# Patient Record
Sex: Male | Born: 1990 | Race: Black or African American | Hispanic: No | Marital: Single | Smoking: Never smoker
Health system: Southern US, Community
[De-identification: ages and names within clinical notes are randomized; demographics above are authoritative.]

## PROBLEM LIST (undated history)

## (undated) DIAGNOSIS — I82409 Acute embolism and thrombosis of unspecified deep veins of unspecified lower extremity: Secondary | ICD-10-CM

## (undated) DIAGNOSIS — K5090 Crohn's disease, unspecified, without complications: Secondary | ICD-10-CM

## (undated) HISTORY — PX: COLOSTOMY: SHX63

## (undated) HISTORY — PX: COLON SURGERY: SHX602

---

## 2013-10-27 ENCOUNTER — Non-Acute Institutional Stay (HOSPITAL_COMMUNITY)
Admission: AD | Admit: 2013-10-27 | Discharge: 2013-10-27 | Disposition: A | Discharge status: Home / Self Care | Payer: BC Managed Care – PPO | Source: Ambulatory Visit | Attending: Internal Medicine | Admitting: Internal Medicine

## 2013-10-27 ENCOUNTER — Encounter (HOSPITAL_COMMUNITY): Payer: Self-pay | Admitting: Hematology

## 2013-10-27 DIAGNOSIS — K5090 Crohn's disease, unspecified, without complications: Secondary | ICD-10-CM | POA: Insufficient documentation

## 2013-10-27 HISTORY — DX: Crohn's disease, unspecified, without complications: K50.90

## 2013-10-27 MED ORDER — DIPHENHYDRAMINE HCL 25 MG PO CAPS
25.0000 mg | ORAL_CAPSULE | Freq: Once | ORAL | Status: AC
  Administered 2013-10-27: 50 mg via ORAL
  Filled 2013-10-27: qty 2

## 2013-10-27 MED ORDER — ACETAMINOPHEN 325 MG PO TABS
650.0000 mg | ORAL_TABLET | Freq: Once | ORAL | Status: AC
  Administered 2013-10-27: 650 mg via ORAL
  Filled 2013-10-27: qty 2

## 2013-10-27 MED ORDER — SODIUM CHLORIDE 0.9 % IV SOLN
7.5000 mg/kg | Freq: Once | INTRAVENOUS | Status: AC
  Administered 2013-10-27: 600 mg via INTRAVENOUS
  Filled 2013-10-27: qty 60

## 2013-10-27 NOTE — Discharge Instructions (Signed)
Crohn Disease  Crohn disease is a long-term (chronic) soreness and redness (inflammation) of the intestines (bowel). It can affect any portion of the digestive tract, from the mouth to the anus. It can also cause problems outside the digestive tract. Crohn disease is closely related to a disease called ulcerative colitis (together, these two diseases are called inflammatory bowel disease).   CAUSES   The cause of Crohn disease is not known. One theory is that, in an easily affected person, the immune system is triggered to attack the body's own digestive tissue. Crohn disease runs in families. It seems to be more common in certain geographic areas and amongst certain races. There are no clear-cut dietary causes.   SYMPTOMS   Crohn disease can cause many different symptoms since it can affect many different parts of the body. Symptoms include:  · Fatigue.  · Weight loss.  · Chronic diarrhea, sometime bloody.  · Abdominal pain and cramps.  · Fever.  · Ulcers or canker sores in the mouth or rectum.  · Anemia (low red blood cells).  · Arthritis, skin problems, and eye problems may occur.  Complications of Crohn disease can include:  · Series of holes (perforation) of the bowel.  · Portions of the intestines sticking to each other (adhesions).  · Obstruction of the bowel.  · Fistula formation, typically in the rectal area but also in other areas. A fistula is an opening between the bowels and the outside, or between the bowels and another organ.  · A painful crack in the mucous membrane of the anus (rectal fissure).  DIAGNOSIS   Your caregiver may suspect Crohn disease based on your symptoms and an exam. Blood tests may confirm that there is a problem. You may be asked to submit a stool specimen for examination. X-rays and CT scans may be necessary. Ultimately, the diagnosis is usually made after a procedure that uses a flexible tube that is inserted via your mouth or your anus. This is done under sedation and is called  either an upper endoscopy or colonoscopy. With these tests, the specialist can take tiny tissue samples and remove them from the inside of the bowel (biopsy). Examination of this biopsy tissue under a microscope can reveal Crohn disease as the cause of your symptoms.  Due to the many different forms that Crohn disease can take, symptoms may be present for several years before a diagnosis is made.  TREATMENT   Medications are often used to decrease inflammation and control the immune system. These include medicines related to aspirin, steroid medications, and newer and stronger medications to slow down the immune system. Some medications may be used as suppositories or enemas. A number of other medications are used or have been studied. Your caregiver will make specific recommendations.  HOME CARE INSTRUCTIONS   · Symptoms such as diarrhea can be controlled with medications. Avoid foods that have a laxative effect such as fresh fruit, vegetables, and dairy products. During flare-ups, you can rest your bowel by refraining from solid foods. Drink clear liquids frequently during the day. (Electrolyte or rehydrating fluids are best. Your caregiver can help you with suggestions.) Drink often to prevent loss of body fluids (dehydration). When diarrhea has cleared, eat small meals and more frequently. Avoid food additives and stimulants such as caffeine (coffee, tea, or chocolate). Enzyme supplements may help if you develop intolerance to a sugar in dairy products (lactose). Ask your caregiver or dietitian about specific dietary instructions.  · Try to   maintain a positive attitude. Learn relaxation techniques such as self-hypnosis, mental imaging, and muscle relaxation.  · If possible, avoid stresses which can aggravate your condition.  · Exercise regularly.  · Follow your diet.  · Always get plenty of rest.  SEEK MEDICAL CARE IF:   · Your symptoms fail to improve after a week or two of new treatment.  · You experience  continued weight loss.  · You have ongoing cramps or loose bowels.  · You develop a new skin rash, skin sores, or eye problems.  SEEK IMMEDIATE MEDICAL CARE IF:   · You have worsening of your symptoms or develop new symptoms.  · You have a fever.  · You develop bloody diarrhea.  · You develop severe abdominal pain.  MAKE SURE YOU:   · Understand these instructions.  · Will watch your condition.  · Will get help right away if you are not doing well or get worse.  Document Released: 10/09/2004 Document Revised: 05/16/2013 Document Reviewed: 09/07/2006  ExitCare® Patient Information ©2015 [employer]. This information is not intended to replace advice given to you by your health care provider. Make sure you discuss any questions you have with your health care provider.

## 2013-10-27 NOTE — Procedures (Signed)
[facility]  Procedure Note  Tom Ferguson [id number] DOB: 26-Jan-1990 DOA: 10/27/2013   PCP: Dr. Kirtland BouchardK. Shelton   Associated Diagnosis: Crohn's Disease  Procedure Note: A copy of MD progress notes and orders and labs faxed to pharmacy, IV access obtained with assist by IV team RN, Premeds given, Infusion completed as ordered, VS completed per protocol. IV discontinued.   Condition During Procedure: Patient tolerated well. No complaints. VSS   Condition at Discharge: Tolerated Well. No complaints. VSS. Patient in no apparent distress. Instructed patient to seek medical attention if any changes in condition, patient acknowledges. Patient discharged home. Patient left day hospital with belongings ambulatory via self.    Allyn KennerBushnell, [name], RN  [facility]

## 2013-12-22 ENCOUNTER — Ambulatory Visit (HOSPITAL_COMMUNITY)
Admission: RE | Admit: 2013-12-22 | Discharge: 2013-12-22 | Disposition: A | Discharge status: Home / Self Care | Payer: BC Managed Care – PPO | Source: Ambulatory Visit | Attending: Gastroenterology | Admitting: Gastroenterology

## 2013-12-22 ENCOUNTER — Non-Acute Institutional Stay (HOSPITAL_COMMUNITY): Payer: BC Managed Care – PPO

## 2013-12-22 ENCOUNTER — Non-Acute Institutional Stay (HOSPITAL_COMMUNITY)
Admission: AD | Admit: 2013-12-22 | Payer: BC Managed Care – PPO | Source: Ambulatory Visit | Admitting: Gastroenterology

## 2013-12-22 DIAGNOSIS — K5090 Crohn's disease, unspecified, without complications: Secondary | ICD-10-CM | POA: Diagnosis not present

## 2013-12-22 MED ORDER — DIPHENHYDRAMINE HCL 25 MG PO CAPS
50.0000 mg | ORAL_CAPSULE | Freq: Once | ORAL | Status: AC
  Administered 2013-12-22: 50 mg via ORAL

## 2013-12-22 MED ORDER — METHYLPREDNISOLONE SODIUM SUCC 40 MG IJ SOLR
40.0000 mg | Freq: Once | INTRAMUSCULAR | Status: AC
  Administered 2013-12-22: 40 mg via INTRAVENOUS
  Filled 2013-12-22: qty 1

## 2013-12-22 MED ORDER — DIPHENHYDRAMINE HCL 25 MG PO CAPS
ORAL_CAPSULE | ORAL | Status: AC
  Filled 2013-12-22: qty 2

## 2013-12-22 MED ORDER — SODIUM CHLORIDE 0.9 % IV SOLN
7.5000 mg/kg | Freq: Once | INTRAVENOUS | Status: AC
  Administered 2013-12-22: 600 mg via INTRAVENOUS
  Filled 2013-12-22: qty 60

## 2013-12-22 MED ORDER — ACETAMINOPHEN 325 MG PO TABS: 650.0000 mg | ORAL_TABLET | Freq: Once | ORAL | Status: DC

## 2013-12-22 MED ORDER — ACETAMINOPHEN 325 MG PO TABS
650.0000 mg | ORAL_TABLET | Freq: Once | ORAL | Status: AC
  Administered 2013-12-22: 650 mg via ORAL
  Filled 2013-12-22: qty 2

## 2013-12-22 NOTE — Procedures (Signed)
[facility]  Procedure Note  Tom Ferguson [id number] DOB: 1990/11/25 DOA: (Not on file)   PCP: No primary care provider on file.   Associated Diagnosis: Crohn's Disease  Procedure Note: Infusion of Remicade   Condition During Procedure:  Pt tolerated well   Condition at Discharge:  Pt alert, oriented, ambulatory; no complications noted   Katrinka Blazing, Joslyn Hy, RN  [facility]

## 2014-01-31 ENCOUNTER — Telehealth: Payer: Self-pay | Admitting: Gastroenterology

## 2014-01-31 ENCOUNTER — Other Ambulatory Visit: Payer: Self-pay | Admitting: Gastroenterology

## 2014-01-31 DIAGNOSIS — K50919 Crohn's disease, unspecified, with unspecified complications: Secondary | ICD-10-CM

## 2014-01-31 DIAGNOSIS — D509 Iron deficiency anemia, unspecified: Secondary | ICD-10-CM

## 2014-01-31 NOTE — Telephone Encounter (Signed)
Received call from [facility] at Dr. Kathline Magic office to schedule Remicade infusion for 02/16/14. Lenna Sciara will call insurance company to obtain prior authorization.

## 2014-02-07 ENCOUNTER — Ambulatory Visit
Admission: RE | Admit: 2014-02-07 | Discharge: 2014-02-07 | Disposition: A | Discharge status: Home / Self Care | Payer: BLUE CROSS/BLUE SHIELD | Source: Ambulatory Visit | Attending: Gastroenterology | Admitting: Gastroenterology

## 2014-02-07 DIAGNOSIS — K50919 Crohn's disease, unspecified, with unspecified complications: Secondary | ICD-10-CM

## 2014-02-07 DIAGNOSIS — D509 Iron deficiency anemia, unspecified: Secondary | ICD-10-CM

## 2014-02-09 ENCOUNTER — Other Ambulatory Visit: Payer: Self-pay | Admitting: Gastroenterology

## 2014-02-09 DIAGNOSIS — D509 Iron deficiency anemia, unspecified: Secondary | ICD-10-CM

## 2014-02-09 DIAGNOSIS — K50919 Crohn's disease, unspecified, with unspecified complications: Secondary | ICD-10-CM

## 2014-02-14 ENCOUNTER — Telehealth: Payer: Self-pay | Admitting: Internal Medicine

## 2014-02-14 ENCOUNTER — Ambulatory Visit
Admission: RE | Admit: 2014-02-14 | Discharge: 2014-02-14 | Disposition: A | Discharge status: Home / Self Care | Payer: BLUE CROSS/BLUE SHIELD | Source: Ambulatory Visit | Attending: Gastroenterology | Admitting: Gastroenterology

## 2014-02-14 DIAGNOSIS — D509 Iron deficiency anemia, unspecified: Secondary | ICD-10-CM

## 2014-02-14 DIAGNOSIS — K50919 Crohn's disease, unspecified, with unspecified complications: Secondary | ICD-10-CM

## 2014-02-14 MED ORDER — IOHEXOL 300 MG/ML  SOLN
125.0000 mL | Freq: Once | INTRAMUSCULAR | Status: AC | PRN
  Administered 2014-02-14: 125 mL via INTRAVENOUS

## 2014-02-16 ENCOUNTER — Ambulatory Visit (HOSPITAL_COMMUNITY)
Admission: RE | Admit: 2014-02-16 | Discharge: 2014-02-16 | Disposition: A | Discharge status: Home / Self Care | Payer: BLUE CROSS/BLUE SHIELD | Source: Ambulatory Visit | Attending: Gastroenterology | Admitting: Gastroenterology

## 2014-02-16 DIAGNOSIS — Z79899 Other long term (current) drug therapy: Secondary | ICD-10-CM | POA: Insufficient documentation

## 2014-02-16 DIAGNOSIS — K5090 Crohn's disease, unspecified, without complications: Secondary | ICD-10-CM | POA: Diagnosis present

## 2014-02-16 MED ORDER — ACETAMINOPHEN 325 MG PO TABS
650.0000 mg | ORAL_TABLET | Freq: Once | ORAL | Status: AC
  Administered 2014-02-16: 650 mg via ORAL
  Filled 2014-02-16: qty 2

## 2014-02-16 MED ORDER — SODIUM CHLORIDE 0.9 % IV SOLN
7.5000 mg/kg | Freq: Once | INTRAVENOUS | Status: AC
  Administered 2014-02-16: 600 mg via INTRAVENOUS
  Filled 2014-02-16: qty 60

## 2014-02-16 MED ORDER — DIPHENHYDRAMINE HCL 25 MG PO CAPS
50.0000 mg | ORAL_CAPSULE | Freq: Once | ORAL | Status: AC
  Administered 2014-02-16: 50 mg via ORAL
  Filled 2014-02-16: qty 2

## 2014-02-16 MED ORDER — METHYLPREDNISOLONE SODIUM SUCC 125 MG IJ SOLR
40.0000 mg | Freq: Once | INTRAMUSCULAR | Status: AC
  Administered 2014-02-16: 40 mg via INTRAVENOUS
  Filled 2014-02-16: qty 2

## 2014-02-16 NOTE — Procedures (Signed)
[facility]  Procedure Note  Tom Ferguson [id number] DOB: November 23, 1990 DOA: 02/16/2014   Dr. Bosie Clos  Associated Diagnosis: Chron's Disease  Procedure Note: IV started, pre medications given, Remicaid infused per order, IV discontinued   Condition During Procedure: denies any complaints   Condition at Discharge: patient stable at discharge   Lanae Boast, RN  [facility]

## 2014-02-16 NOTE — Progress Notes (Signed)
School excuse printed thru system.  Original excuse had incorrect return date of 02/27/2014.  Note reprinted, and corrected to show patient able to return to school on 02/17/2014.

## 2014-04-13 ENCOUNTER — Ambulatory Visit (HOSPITAL_COMMUNITY)
Admission: RE | Admit: 2014-04-13 | Discharge: 2014-04-13 | Disposition: A | Discharge status: Home / Self Care | Payer: BLUE CROSS/BLUE SHIELD | Source: Ambulatory Visit | Attending: Gastroenterology | Admitting: Gastroenterology

## 2014-04-13 DIAGNOSIS — K5090 Crohn's disease, unspecified, without complications: Secondary | ICD-10-CM | POA: Diagnosis present

## 2014-04-13 MED ORDER — SODIUM CHLORIDE 0.9 % IV SOLN
7.5000 mg/kg | Freq: Once | INTRAVENOUS | Status: AC
  Administered 2014-04-13: 600 mg via INTRAVENOUS
  Filled 2014-04-13: qty 60

## 2014-04-13 MED ORDER — METHYLPREDNISOLONE SODIUM SUCC 125 MG IJ SOLR
40.0000 mg | Freq: Once | INTRAMUSCULAR | Status: AC
  Administered 2014-04-13: 40 mg via INTRAVENOUS
  Filled 2014-04-13: qty 2

## 2014-04-13 MED ORDER — DIPHENHYDRAMINE HCL 25 MG PO CAPS
50.0000 mg | ORAL_CAPSULE | Freq: Once | ORAL | Status: AC
  Administered 2014-04-13: 50 mg via ORAL
  Filled 2014-04-13: qty 2

## 2014-04-13 MED ORDER — ACETAMINOPHEN 325 MG PO TABS
650.0000 mg | ORAL_TABLET | Freq: Once | ORAL | Status: AC
  Administered 2014-04-13: 650 mg via ORAL
  Filled 2014-04-13: qty 2

## 2014-04-13 NOTE — Procedures (Signed)
[facility]  Procedure Note  Wlliam Grosso [id number] DOB: 1990-11-06 DOA: 04/13/2014   Dr. Bosie Clos  Associated Diagnosis: Chron's disease  Procedure Note:   IV started, pre medications given per order, Remicade infused per order, IV discontinued   Condition During Procedure:  Patient stable, no signs/symptoms of reaction   Condition at Discharge:  Patient stable, ambulatory at discharge   Lanae Boast, RN  [facility]

## 2014-05-25 ENCOUNTER — Telehealth: Payer: Self-pay | Admitting: Oncology

## 2014-05-25 NOTE — Telephone Encounter (Signed)
Called left second message regarding new pt appt.

## 2014-06-01 ENCOUNTER — Telehealth: Payer: Self-pay | Admitting: Oncology

## 2014-06-01 NOTE — Telephone Encounter (Signed)
left third message for patient.    Called referring office to notify them of unsuccessful attempts to reach patient.

## 2014-06-08 ENCOUNTER — Ambulatory Visit (HOSPITAL_COMMUNITY)
Admission: RE | Admit: 2014-06-08 | Discharge: 2014-06-08 | Disposition: A | Discharge status: Home / Self Care | Payer: BLUE CROSS/BLUE SHIELD | Source: Ambulatory Visit | Attending: Gastroenterology | Admitting: Gastroenterology

## 2014-06-08 DIAGNOSIS — K5090 Crohn's disease, unspecified, without complications: Secondary | ICD-10-CM | POA: Diagnosis present

## 2014-06-08 MED ORDER — INFLIXIMAB 100 MG IV SOLR
7.5000 mg/kg | Freq: Once | INTRAVENOUS | Status: AC
  Administered 2014-06-08: 700 mg via INTRAVENOUS
  Filled 2014-06-08: qty 70

## 2014-06-08 MED ORDER — METHYLPREDNISOLONE SODIUM SUCC 125 MG IJ SOLR
40.0000 mg | Freq: Once | INTRAMUSCULAR | Status: AC
  Administered 2014-06-08: 40 mg via INTRAVENOUS
  Filled 2014-06-08: qty 2

## 2014-06-08 MED ORDER — ACETAMINOPHEN 325 MG PO TABS
650.0000 mg | ORAL_TABLET | Freq: Once | ORAL | Status: AC
  Administered 2014-06-08: 650 mg via ORAL
  Filled 2014-06-08: qty 2

## 2014-06-08 MED ORDER — SODIUM CHLORIDE 0.9 % IV SOLN: 7.5000 mg/kg | Freq: Once | INTRAVENOUS | Status: DC

## 2014-06-08 MED ORDER — DIPHENHYDRAMINE HCL 25 MG PO CAPS
50.0000 mg | ORAL_CAPSULE | Freq: Once | ORAL | Status: AC
  Administered 2014-06-08: 50 mg via ORAL
  Filled 2014-06-08: qty 2

## 2014-06-08 NOTE — Progress Notes (Signed)
Patient IV started, premeds given and remicade infused. Tolerated infusion well. IV removed. Patient next appointment scheduled for August 10, 2014 @ 9am. Patient alert, oriented and ambulatory after infusion.

## 2014-08-09 ENCOUNTER — Ambulatory Visit (HOSPITAL_COMMUNITY): Admission: RE | Admit: 2014-08-09 | Payer: BLUE CROSS/BLUE SHIELD | Source: Ambulatory Visit

## 2014-10-03 ENCOUNTER — Ambulatory Visit (HOSPITAL_COMMUNITY)
Admission: RE | Admit: 2014-10-03 | Discharge: 2014-10-03 | Disposition: A | Discharge status: Home / Self Care | Payer: BLUE CROSS/BLUE SHIELD | Source: Ambulatory Visit | Attending: Gastroenterology | Admitting: Gastroenterology

## 2014-10-03 DIAGNOSIS — K5090 Crohn's disease, unspecified, without complications: Secondary | ICD-10-CM | POA: Diagnosis present

## 2014-10-03 MED ORDER — ACETAMINOPHEN 325 MG PO TABS
650.0000 mg | ORAL_TABLET | Freq: Once | ORAL | Status: AC
  Administered 2014-10-03: 650 mg via ORAL
  Filled 2014-10-03: qty 2

## 2014-10-03 MED ORDER — SODIUM CHLORIDE 0.9 % IV SOLN
7.5000 mg/kg | Freq: Once | INTRAVENOUS | Status: AC
  Administered 2014-10-03: 800 mg via INTRAVENOUS
  Filled 2014-10-03: qty 80

## 2014-10-03 MED ORDER — DIPHENHYDRAMINE HCL 25 MG PO CAPS
50.0000 mg | ORAL_CAPSULE | Freq: Once | ORAL | Status: AC
  Administered 2014-10-03: 50 mg via ORAL
  Filled 2014-10-03: qty 2

## 2014-10-03 MED ORDER — METHYLPREDNISOLONE SODIUM SUCC 125 MG IJ SOLR
40.0000 mg | Freq: Once | INTRAMUSCULAR | Status: AC
  Administered 2014-10-03: 40 mg via INTRAVENOUS
  Filled 2014-10-03: qty 2

## 2014-10-03 NOTE — Progress Notes (Signed)
Deljuan Blaz [id number] DOB: 09-07-1990 DOA:10/03/2014   Dr. Bosie Clos  Associated Diagnosis: Chron's disease  Procedure Note: IV started, pre medications given per order, Remicade infused per order, IV discontinued   Condition During Procedure: Patient stable, no signs/symptoms of reaction   Condition at Discharge: Patient stable, ambulatory at discharge

## 2014-11-28 ENCOUNTER — Ambulatory Visit (HOSPITAL_COMMUNITY)
Admission: RE | Admit: 2014-11-28 | Discharge: 2014-11-28 | Disposition: A | Discharge status: Home / Self Care | Payer: BLUE CROSS/BLUE SHIELD | Source: Ambulatory Visit | Attending: Gastroenterology | Admitting: Gastroenterology

## 2014-11-28 DIAGNOSIS — K5090 Crohn's disease, unspecified, without complications: Secondary | ICD-10-CM | POA: Diagnosis present

## 2014-11-28 MED ORDER — ACETAMINOPHEN 325 MG PO TABS
650.0000 mg | ORAL_TABLET | Freq: Four times a day (QID) | ORAL | Status: DC | PRN
  Administered 2014-11-28: 650 mg via ORAL
  Filled 2014-11-28: qty 2

## 2014-11-28 MED ORDER — DIPHENHYDRAMINE HCL 25 MG PO CAPS
50.0000 mg | ORAL_CAPSULE | Freq: Four times a day (QID) | ORAL | Status: DC | PRN
  Administered 2014-11-28: 50 mg via ORAL
  Filled 2014-11-28: qty 2

## 2014-11-28 MED ORDER — SODIUM CHLORIDE 0.9 % IV SOLN
7.0000 mg/kg | Freq: Once | INTRAVENOUS | Status: AC
  Administered 2014-11-28: 700 mg via INTRAVENOUS
  Filled 2014-11-28: qty 70

## 2014-11-28 MED ORDER — METHYLPREDNISOLONE SODIUM SUCC 125 MG IJ SOLR
40.0000 mg | Freq: Once | INTRAMUSCULAR | Status: AC
  Administered 2014-11-28: 40 mg via INTRAVENOUS
  Filled 2014-11-28: qty 2

## 2014-11-28 NOTE — Procedures (Addendum)
[facility]  Procedure Note  Kebin Merrick [id number] DOB: 1990-07-30 DOA: 11/28/2014   Dr. Bosie Clos  Associated Diagnosis: Chron's disease  Procedure Note: IV started, pre medications given per order, Remicade infused per order, IV discontinued   Condition During Procedure: Patient stable, no signs/symptoms of reaction   Condition at Discharge: Patient stable, ambulatory at discharge  Patient instructed to have provider send a new order for next scheduled infusion on 01/10/20107.  Patient verbalizes understanding.  Lanae Boast, RN  [facility]

## 2015-01-23 ENCOUNTER — Ambulatory Visit (HOSPITAL_COMMUNITY)
Admission: RE | Admit: 2015-01-23 | Payer: BLUE CROSS/BLUE SHIELD | Source: Ambulatory Visit | Attending: Gastroenterology | Admitting: Gastroenterology

## 2015-01-29 ENCOUNTER — Ambulatory Visit (HOSPITAL_COMMUNITY)
Admission: RE | Admit: 2015-01-29 | Discharge: 2015-01-29 | Disposition: A | Discharge status: Home / Self Care | Payer: BLUE CROSS/BLUE SHIELD | Source: Ambulatory Visit | Attending: Gastroenterology | Admitting: Gastroenterology

## 2015-01-29 DIAGNOSIS — K5090 Crohn's disease, unspecified, without complications: Secondary | ICD-10-CM | POA: Diagnosis present

## 2015-01-29 MED ORDER — INFLIXIMAB 100 MG IV SOLR
7.0000 mg/kg | Freq: Once | INTRAVENOUS | Status: AC
  Administered 2015-01-29: 600 mg via INTRAVENOUS
  Filled 2015-01-29: qty 60

## 2015-01-29 MED ORDER — ACETAMINOPHEN 325 MG PO TABS
650.0000 mg | ORAL_TABLET | Freq: Once | ORAL | Status: AC
  Administered 2015-01-29: 650 mg via ORAL
  Filled 2015-01-29: qty 2

## 2015-01-29 MED ORDER — DIPHENHYDRAMINE HCL 25 MG PO CAPS
50.0000 mg | ORAL_CAPSULE | Freq: Once | ORAL | Status: AC
  Administered 2015-01-29: 50 mg via ORAL
  Filled 2015-01-29: qty 2

## 2015-01-29 MED ORDER — METHYLPREDNISOLONE SODIUM SUCC 125 MG IJ SOLR
40.0000 mg | Freq: Once | INTRAMUSCULAR | Status: AC
  Administered 2015-01-29: 40 mg via INTRAVENOUS
  Filled 2015-01-29: qty 2

## 2015-01-29 NOTE — Procedures (Signed)
[facility]  Procedure Note  Tom Ferguson [id number] DOB: 1990/01/22 DOA: 01/29/2015   PCP: No primary care provider on file.   Associated Diagnosis: Crohn's Disease  Procedure Note: IV infusion of remicade; PO tylenol and benadryl; IV push of solumedrol pre infusion   Condition During Procedure:  Pt tolerated well   Condition at Discharge:  No complications noted    Bluford Kaufmann, RN  [facility]

## 2015-03-08 ENCOUNTER — Encounter (HOSPITAL_COMMUNITY): Payer: Self-pay | Admitting: *Deleted

## 2015-03-08 ENCOUNTER — Emergency Department (HOSPITAL_COMMUNITY)
Admission: EM | Admit: 2015-03-08 | Discharge: 2015-03-09 | Disposition: A | Discharge status: Home / Self Care | Payer: BLUE CROSS/BLUE SHIELD | Attending: Emergency Medicine | Admitting: Emergency Medicine

## 2015-03-08 DIAGNOSIS — G43919 Migraine, unspecified, intractable, without status migrainosus: Secondary | ICD-10-CM | POA: Insufficient documentation

## 2015-03-08 DIAGNOSIS — Z8719 Personal history of other diseases of the digestive system: Secondary | ICD-10-CM | POA: Insufficient documentation

## 2015-03-08 DIAGNOSIS — D649 Anemia, unspecified: Secondary | ICD-10-CM | POA: Insufficient documentation

## 2015-03-08 DIAGNOSIS — R51 Headache: Secondary | ICD-10-CM | POA: Diagnosis present

## 2015-03-08 MED ORDER — SODIUM CHLORIDE 0.9 % IV BOLUS (SEPSIS)
1000.0000 mL | Freq: Once | INTRAVENOUS | Status: AC
  Administered 2015-03-08: 1000 mL via INTRAVENOUS

## 2015-03-08 MED ORDER — KETOROLAC TROMETHAMINE 15 MG/ML IJ SOLN
15.0000 mg | Freq: Once | INTRAMUSCULAR | Status: AC
Start: 2015-03-08 — End: 2015-03-08
  Administered 2015-03-08: 15 mg via INTRAVENOUS
  Filled 2015-03-08: qty 1

## 2015-03-08 MED ORDER — DIPHENHYDRAMINE HCL 50 MG/ML IJ SOLN
25.0000 mg | Freq: Once | INTRAMUSCULAR | Status: AC
  Administered 2015-03-08: 25 mg via INTRAVENOUS
  Filled 2015-03-08: qty 1

## 2015-03-08 MED ORDER — METOCLOPRAMIDE HCL 5 MG/ML IJ SOLN
10.0000 mg | Freq: Once | INTRAMUSCULAR | Status: AC
  Administered 2015-03-08: 10 mg via INTRAVENOUS
  Filled 2015-03-08: qty 2

## 2015-03-08 NOTE — ED Notes (Signed)
Pt states R frontal headache since Sunday.  Pain varies in intensity but is constant and is not relieved by tylenol.  States photophobia and intermittent blurred vision, but denies nausea.

## 2015-03-08 NOTE — ED Provider Notes (Addendum)
CSN: [id number]     Arrival date & time 03/08/15  1759 History  By signing my name below, I, Nicole Kindred, attest that this documentation has been prepared under the direction and in the presence of Blanchie Dessert, MD.   Electronically Signed: Nicole Kindred, ED Scribe. 03/09/2015. 2:39 AM     Chief Complaint  Patient presents with  . Headache    The history is provided by the patient. No language interpreter was used.   HPI Comments: Tom Ferguson is a 25 y.o. male with PMHx of Crohn's disease who presents to the Emergency Department complaining of gradual onset, intermittent, 7/10, headache, onset about five days ago. He states a slight sore throat before the symptoms began and says that the pain from the headache can worsen to 9/10 at times. Pt reports associated pain and pressure behind his right eye, mild neck pain, photophobia, and chills. He also states intermittent visual disturbances in the right eye that come and go with his headaches. Pt has taken ibuprofen at home with no relief to symptoms. Pt denies congestion, cough, no dental pain, nausea, weakness, or any other pertinent symptoms. Pt is on remicade infusions but says he has never had any complications with the medicine.      Past Medical History  Diagnosis Date  . Crohn's disease [facility])    Past Surgical History  Procedure Laterality Date  . Colon surgery      colostomy   No family history on file. Social History  Substance Use Topics  . Smoking status: Never Smoker   . Smokeless tobacco: None  . Alcohol Use: No    Review of Systems A complete 10 system review of systems was obtained and all systems are negative except as noted in the HPI and PMH.    Allergies  Review of patient's allergies indicates no known allergies.  Home Medications   Prior to Admission medications   Medication Sig Start Date End Date Taking? Authorizing Provider  acetaminophen (TYLENOL) 325 MG tablet Take 650 mg by mouth  every 6 (six) hours as needed for headache.   Yes Historical Provider, MD  ibuprofen (ADVIL,MOTRIN) 200 MG tablet Take 200 mg by mouth every 6 (six) hours as needed for headache.   Yes Historical Provider, MD   BP 128/64 mmHg  Pulse 84  Temp(Src) 98.6 F (37 C) (Oral)  Resp 16  Ht 6' 2"  (1.88 m)  Wt 238 lb (107.956 kg)  BMI 30.54 kg/m2  SpO2 100% Physical Exam  Constitutional: He is oriented to person, place, and time. He appears well-developed and well-nourished.  HENT:  Head: Normocephalic and atraumatic.  Right Ear: Tympanic membrane, external ear and ear canal normal.  Left Ear: Tympanic membrane, external ear and ear canal normal.  Nose: Nose normal.  Mouth/Throat: Oropharynx is clear and moist and mucous membranes are normal.  Eyes: EOM are normal. Pupils are equal, round, and reactive to light.  Slight photophobia.   Neck: Normal range of motion. Muscular tenderness present. No Brudzinski's sign and no Kernig's sign noted.    No meningeal signs.  Mild TTP to neck.   Cardiovascular: Normal rate, regular rhythm, normal heart sounds and intact distal pulses.   Pulmonary/Chest: Effort normal and breath sounds normal. No respiratory distress.  Abdominal: Soft. He exhibits no distension. There is no tenderness.  Musculoskeletal: Normal range of motion.  Neurological: He is alert and oriented to person, place, and time. He has normal strength. No cranial nerve deficit or sensory deficit.  Coordination and gait normal.  5/5 strength in all extremities and sensation intact  Skin: Skin is warm and dry.  Psychiatric: He has a normal mood and affect. Judgment normal.  Nursing note and vitals reviewed.   ED Course  Procedures (including critical care time) DIAGNOSTIC STUDIES: Oxygen Saturation is 100% on RA, normal by my interpretation.    COORDINATION OF CARE: 11:27 PM-Discussed treatment plan which includes benadryl, Toradol, CT head without contrast, CBC, and I-state chem 8  with pt at bedside and pt agreed to plan.    Labs Review Labs Reviewed  CBC WITH DIFFERENTIAL/PLATELET - Abnormal; Notable for the following:    WBC 11.5 (*)    Hemoglobin 6.0 (*)    HCT 24.7 (*)    MCV 56.0 (*)    MCH 13.6 (*)    MCHC 24.3 (*)    RDW 24.3 (*)    Platelets 702 (*)    Neutro Abs 8.8 (*)    All other components within normal limits  I-STAT CHEM 8, ED - Abnormal; Notable for the following:    Glucose, Bld 117 (*)    Hemoglobin 7.8 (*)    HCT 23.0 (*)    All other components within normal limits  TYPE AND SCREEN  PREPARE RBC (CROSSMATCH)  ABO/RH    Imaging Review Ct Head Wo Contrast  03/09/2015  CLINICAL DATA:  Acute onset of right frontal headache. Photophobia and blurred vision. Initial encounter. EXAM: CT HEAD WITHOUT CONTRAST TECHNIQUE: Contiguous axial images were obtained from the base of the skull through the vertex without intravenous contrast. COMPARISON:  None. FINDINGS: There is no evidence of acute infarction, mass lesion, or intra- or extra-axial hemorrhage on CT. The posterior fossa, including the cerebellum, brainstem and fourth ventricle, is within normal limits. The third and lateral ventricles, and basal ganglia are unremarkable in appearance. The cerebral hemispheres are symmetric in appearance, with normal gray-white differentiation. No mass effect or midline shift is seen. There is no evidence of fracture; visualized osseous structures are unremarkable in appearance. The visualized portions of the orbits are within normal limits. The paranasal sinuses and mastoid air cells are well-aerated. No significant soft tissue abnormalities are seen. IMPRESSION: Unremarkable noncontrast CT of the head. Electronically Signed   By: Garald Balding M.D.   On: 03/09/2015 01:11   I have personally reviewed and evaluated these images and lab results as part of my medical decision-making.   EKG Interpretation None      MDM   Final diagnoses:  Anemia,  unspecified anemia type  Intractable migraine without status migrainosus, unspecified migraine type   Pt with sx most closely related to a migraine HA without sx suggestive of SAH(sudden onset, worst of life, or deficits), infection, or cavernous vein thrombosis.  Pt has felt chills occasionally but has never noted a fever in the last 5 days.  Afebrile here.  He denies any chronic meds other than remicade.  no hx of migraines or family hx.  He denies congestion or sinus sx.  Normal neuro exam and vital signs. Will give HA cocktail and get CT of head and CBC/chem 8.  4:59 AM Head CT was negative however when labs returned patient was found to be anemic with hemoglobin of 6. Patient states his hemoglobin is usually low but when it gets to 6 he will get transfusions. He denies any rectal bleeding at this time and states the anemia has been ongoing for some time.  His headache is much improved after  headache cocktail however will give patient 1 unit of blood which may also improve his headache.  6:40 AM Pt's headache has not returned.  He tolerated transfusion well.  Will d/c home. I personally performed the services described in this documentation, which was scribed in my presence.  The recorded information has been reviewed and considered.      Blanchie Dessert, MD 03/09/15 0500  Blanchie Dessert, MD 03/09/15 [phone]

## 2015-03-09 ENCOUNTER — Emergency Department (HOSPITAL_COMMUNITY): Payer: BLUE CROSS/BLUE SHIELD

## 2015-03-09 LAB — I-STAT CHEM 8, ED
BUN: 9 mg/dL (ref 6–20)
CALCIUM ION: 1.13 mmol/L (ref 1.12–1.23)
Chloride: 102 mmol/L (ref 101–111)
Creatinine, Ser: 1.1 mg/dL (ref 0.61–1.24)
GLUCOSE: 117 mg/dL — AB (ref 65–99)
HCT: 23 % — ABNORMAL LOW (ref 39.0–52.0)
HEMOGLOBIN: 7.8 g/dL — AB (ref 13.0–17.0)
POTASSIUM: 3.7 mmol/L (ref 3.5–5.1)
Sodium: 140 mmol/L (ref 135–145)
TCO2: 25 mmol/L (ref 0–100)

## 2015-03-09 LAB — CBC WITH DIFFERENTIAL/PLATELET
Basophils Absolute: 0 10*3/uL (ref 0.0–0.1)
Basophils Relative: 0 %
EOS PCT: 0 %
Eosinophils Absolute: 0 10*3/uL (ref 0.0–0.7)
HCT: 24.7 % — ABNORMAL LOW (ref 39.0–52.0)
Hemoglobin: 6 g/dL — CL (ref 13.0–17.0)
LYMPHS ABS: 1.7 10*3/uL (ref 0.7–4.0)
Lymphocytes Relative: 15 %
MCH: 13.6 pg — ABNORMAL LOW (ref 26.0–34.0)
MCHC: 24.3 g/dL — AB (ref 30.0–36.0)
MCV: 56 fL — AB (ref 78.0–100.0)
MONO ABS: 1 10*3/uL (ref 0.1–1.0)
Monocytes Relative: 9 %
Neutro Abs: 8.8 10*3/uL — ABNORMAL HIGH (ref 1.7–7.7)
Neutrophils Relative %: 76 %
PLATELETS: 702 10*3/uL — AB (ref 150–400)
RBC: 4.41 MIL/uL (ref 4.22–5.81)
RDW: 24.3 % — AB (ref 11.5–15.5)
WBC: 11.5 10*3/uL — AB (ref 4.0–10.5)

## 2015-03-09 LAB — ABO/RH: ABO/RH(D): AB NEG

## 2015-03-09 LAB — PREPARE RBC (CROSSMATCH)

## 2015-03-09 MED ORDER — HYDROCODONE-ACETAMINOPHEN 5-325 MG PO TABS: 1.0000 | ORAL_TABLET | Freq: Four times a day (QID) | ORAL | Status: DC | PRN

## 2015-03-09 MED ORDER — SODIUM CHLORIDE 0.9 % IV SOLN
Freq: Once | INTRAVENOUS | Status: AC
  Administered 2015-03-09: 06:00:00 via INTRAVENOUS

## 2015-03-09 NOTE — Discharge Instructions (Signed)
Blood Transfusion, Care After °Refer to this sheet in the next few weeks. These instructions provide you with information about caring for yourself after your procedure. Your health care provider may also give you more specific instructions. Your treatment has been planned according to current medical practices, but problems sometimes occur. Call your health care provider if you have any problems or questions after your procedure. °WHAT TO EXPECT AFTER THE PROCEDURE °After your procedure, it is common to have: °· Bruising and soreness at the IV site. °· Chills or fever. °· Headache. °HOME CARE INSTRUCTIONS °· Take medicines only as directed by your health care provider. Ask your health care provider if you can take an over-the-counter pain reliever in case you have a fever or headache a day or two after your transfusion. °· Return to your normal activities as directed by your health care provider. °SEEK MEDICAL CARE IF:  °· You develop redness or irritation at your IV site. °· You have persistent fever, chills, or headache. °· Your urine is darker than normal. °· Your urine turns pink, red, or brown.   °· The white part of your eye turns yellow (jaundice).   °· You feel weak after doing your normal activities.   °SEEK IMMEDIATE MEDICAL CARE IF:  °· You have trouble breathing. °· You have fever and chills along with: °¨ Anxiety. °¨ Chest or back pain. °¨ Flushed skin. °¨ Clammy skin. °¨ A rapid heartbeat. °¨ Nausea. °  °This information is not intended to replace advice given to you by your health care provider. Make sure you discuss any questions you have with your health care provider. °  °Document Released: 01/20/2014 Document Reviewed: 01/20/2014 °Elsevier Interactive Patient Education ©2016 [employer] ° °

## 2015-03-09 NOTE — ED Notes (Signed)
Pt verbalized understanding of d/c instructions and has no further questions. Pt stable and NAD. Pt educated about post transfusion signs and sx's to look out for.

## 2015-03-10 LAB — TYPE AND SCREEN
ABO/RH(D): AB NEG
Antibody Screen: NEGATIVE
Unit division: 0

## 2015-03-16 ENCOUNTER — Ambulatory Visit (HOSPITAL_COMMUNITY)
Admission: RE | Admit: 2015-03-16 | Discharge: 2015-03-16 | Disposition: A | Discharge status: Home / Self Care | Payer: BLUE CROSS/BLUE SHIELD | Source: Ambulatory Visit | Attending: Gastroenterology | Admitting: Gastroenterology

## 2015-03-16 DIAGNOSIS — K5090 Crohn's disease, unspecified, without complications: Secondary | ICD-10-CM | POA: Insufficient documentation

## 2015-03-16 MED ORDER — SODIUM CHLORIDE 0.9 % IV SOLN
7.5000 mg/kg | Freq: Once | INTRAVENOUS | Status: AC
  Administered 2015-03-16: 800 mg via INTRAVENOUS
  Filled 2015-03-16: qty 80

## 2015-03-16 MED ORDER — DIPHENHYDRAMINE HCL 25 MG PO CAPS
50.0000 mg | ORAL_CAPSULE | Freq: Once | ORAL | Status: AC
  Administered 2015-03-16: 50 mg via ORAL
  Filled 2015-03-16: qty 2

## 2015-03-16 MED ORDER — METHYLPREDNISOLONE SODIUM SUCC 125 MG IJ SOLR
40.0000 mg | Freq: Once | INTRAMUSCULAR | Status: AC
  Administered 2015-03-16: 40 mg via INTRAVENOUS
  Filled 2015-03-16: qty 2

## 2015-03-16 MED ORDER — ACETAMINOPHEN 325 MG PO TABS
650.0000 mg | ORAL_TABLET | Freq: Once | ORAL | Status: AC
  Administered 2015-03-16: 650 mg via ORAL
  Filled 2015-03-16: qty 2

## 2015-03-16 NOTE — Progress Notes (Signed)
PCP: No primary care provider on file.   Associated Diagnosis: Crohn's Disease  Procedure Note: IV infusion of remicade; PO tylenol and benadryl; IV push of solumedrol pre infusion   Condition During Procedure: Pt tolerated well   Condition at Discharge: No complications noted

## 2015-04-28 ENCOUNTER — Emergency Department (HOSPITAL_COMMUNITY)
Admission: EM | Admit: 2015-04-28 | Discharge: 2015-04-28 | Disposition: A | Discharge status: Home / Self Care | Payer: BLUE CROSS/BLUE SHIELD | Attending: Emergency Medicine | Admitting: Emergency Medicine

## 2015-04-28 ENCOUNTER — Encounter (HOSPITAL_COMMUNITY): Payer: Self-pay | Admitting: Emergency Medicine

## 2015-04-28 DIAGNOSIS — H578 Other specified disorders of eye and adnexa: Secondary | ICD-10-CM | POA: Diagnosis present

## 2015-04-28 DIAGNOSIS — R21 Rash and other nonspecific skin eruption: Secondary | ICD-10-CM | POA: Insufficient documentation

## 2015-04-28 DIAGNOSIS — N485 Ulcer of penis: Secondary | ICD-10-CM | POA: Insufficient documentation

## 2015-04-28 DIAGNOSIS — Z8719 Personal history of other diseases of the digestive system: Secondary | ICD-10-CM | POA: Insufficient documentation

## 2015-04-28 DIAGNOSIS — R300 Dysuria: Secondary | ICD-10-CM | POA: Insufficient documentation

## 2015-04-28 DIAGNOSIS — R319 Hematuria, unspecified: Secondary | ICD-10-CM | POA: Diagnosis not present

## 2015-04-28 DIAGNOSIS — H109 Unspecified conjunctivitis: Secondary | ICD-10-CM | POA: Insufficient documentation

## 2015-04-28 DIAGNOSIS — R350 Frequency of micturition: Secondary | ICD-10-CM | POA: Diagnosis not present

## 2015-04-28 LAB — URINALYSIS, ROUTINE W REFLEX MICROSCOPIC
Bilirubin Urine: NEGATIVE
GLUCOSE, UA: NEGATIVE mg/dL
Hgb urine dipstick: NEGATIVE
Ketones, ur: NEGATIVE mg/dL
LEUKOCYTES UA: NEGATIVE
Nitrite: NEGATIVE
PH: 6 (ref 5.0–8.0)
PROTEIN: NEGATIVE mg/dL
Specific Gravity, Urine: 1.007 (ref 1.005–1.030)

## 2015-04-28 MED ORDER — CEFTRIAXONE SODIUM 250 MG IJ SOLR
250.0000 mg | Freq: Once | INTRAMUSCULAR | Status: AC
  Administered 2015-04-28: 250 mg via INTRAMUSCULAR
  Filled 2015-04-28: qty 250

## 2015-04-28 MED ORDER — LIDOCAINE HCL (PF) 1 % IJ SOLN
0.9000 mL | Freq: Once | INTRAMUSCULAR | Status: AC
  Administered 2015-04-28: 0.9 mL
  Filled 2015-04-28: qty 5

## 2015-04-28 MED ORDER — POLYMYXIN B-TRIMETHOPRIM 10000-0.1 UNIT/ML-% OP SOLN: 1.0000 [drp] | OPHTHALMIC | Status: DC

## 2015-04-28 MED ORDER — AZITHROMYCIN 250 MG PO TABS
1000.0000 mg | ORAL_TABLET | Freq: Once | ORAL | Status: AC
  Administered 2015-04-28: 1000 mg via ORAL
  Filled 2015-04-28: qty 4

## 2015-04-28 NOTE — ED Provider Notes (Signed)
CSN: [id number]     Arrival date & time 04/28/15  1848 History  By signing my name below, I, Tom Ferguson, attest that this documentation has been prepared under the direction and in the presence of [employer], [employer]. Electronically Signed: Randa Ferguson, ED Scribe. 04/28/2015. 7:26 PM.     Chief Complaint  Patient presents with  . Hematuria  . Eye Problem   Patient is a 25 y.o. male presenting with hematuria and eye problem. The history is provided by the patient. No language interpreter was used.  Hematuria Pertinent negatives include no chest pain and no shortness of breath.  Eye Problem Associated symptoms: discharge and redness    HPI Comments: Tom Ferguson is a 25 y.o. male who presents to the Emergency Department complaining of left eye redness onset 2 days prior. Patient states he had nasal congestion and watery eyes x 1-2 weeks, but two days ago noticed left eye redness and yellow discharge which is worse in the morning. Pt states that he has a Hx of seasonal allergies. Pt states that he has tried OTC benadryl and visine eye drops with no relief.   Pt is also complaining of "discomfort when I use the bathroom [urinate]" that began 5 days ago. Pt reports associated hematuria and urinary frequency. Pt also reports painless genital rash. Pt does report recently having unprotected sex. He states that his partner is concerned for STD's. Pt denies penile discharge, scrotal tenderness, abdominal pain, CP, SOB, or cough.    Past Medical History  Diagnosis Date  . Crohn's disease [facility])    Past Surgical History  Procedure Laterality Date  . Colon surgery      colostomy  . Colostomy     History reviewed. No pertinent family history. Social History  Substance Use Topics  . Smoking status: Never Smoker   . Smokeless tobacco: None  . Alcohol Use: Yes    Review of Systems  HENT: Positive for congestion.   Eyes: Positive for discharge and redness.  Respiratory: Negative  for shortness of breath.   Cardiovascular: Negative for chest pain.  Genitourinary: Positive for dysuria, frequency and hematuria. Negative for discharge.  Skin: Positive for rash.     Allergies  Review of patient's allergies indicates no known allergies.  Home Medications   Prior to Admission medications   Medication Sig Start Date End Date Taking? Authorizing Provider  acetaminophen (TYLENOL) 325 MG tablet Take 650 mg by mouth every 6 (six) hours as needed for headache.    Historical Provider, MD  HYDROcodone-acetaminophen (NORCO/VICODIN) 5-325 MG tablet Take 1-2 tablets by mouth every 6 (six) hours as needed. 03/09/15   Blanchie Dessert, MD  ibuprofen (ADVIL,MOTRIN) 200 MG tablet Take 200 mg by mouth every 6 (six) hours as needed for headache.    Historical Provider, MD  trimethoprim-polymyxin b (POLYTRIM) ophthalmic solution Place 1 drop into the left eye every 4 (four) hours. 04/28/15   Jaime Pilcher Ward, PA-C   BP 120/66 mmHg  Pulse 89  Temp(Src) 99.5 F (37.5 C) (Oral)  Resp 16  SpO2 99%   Physical Exam  Constitutional: He is oriented to person, place, and time. He appears well-developed and well-nourished. No distress.  HENT:  Head: Normocephalic and atraumatic.  OP with erythema, no exudates or tonsillar hypertrophy. + nasal congestion with edema.   Eyes: EOM are normal. Pupils are equal, round, and reactive to light. Right eye exhibits no discharge. Left eye exhibits discharge. Right conjunctiva is not injected. Left conjunctiva  is injected.  Neck: Neck supple. No tracheal deviation present.  Cardiovascular: Normal rate, regular rhythm and normal heart sounds.   Pulmonary/Chest: Effort normal and breath sounds normal. No respiratory distress. He has no wheezes. He has no rales.  Abdominal: Soft. He exhibits no distension. There is no tenderness. There is no rebound and no guarding.  LLQ with colostomy.   Genitourinary:  Chaperone present for exam. 5 mm nontender ulcer  of penile shaft.  No erythema on the penis or testicles. The penis and testicles are nontender. No testicular masses or swelling. No signs of any inguinal hernias. No inguinal lymphadenopathy. No signs of any discharge from the penis. Cremaster reflex present bilaterally.   Musculoskeletal: Normal range of motion.  Neurological: He is alert and oriented to person, place, and time.  Skin: Skin is warm and dry.  Psychiatric: He has a normal mood and affect. His behavior is normal.  Nursing note and vitals reviewed.   ED Course  Procedures (including critical care time) DIAGNOSTIC STUDIES: Oxygen Saturation is 98% on RA, normal by my interpretation.    COORDINATION OF CARE: 7:27 PM-Discussed treatment plan with pt at bedside and pt agreed to plan.     Labs Review Labs Reviewed  URINALYSIS, ROUTINE W REFLEX MICROSCOPIC (NOT AT [facility])  RPR  HIV ANTIBODY (ROUTINE TESTING)  GC/CHLAMYDIA PROBE AMP ([place]) NOT AT [facility]    Imaging Review No results found.    EKG Interpretation None      MDM   Final diagnoses:  Conjunctivitis of left eye   Tom Ferguson presents to ED for two complaints:   1. Nasal congestion x 1-2 weeks, left eye discharge and redness x 2 days. Hx of seasonal allergies. On exam, OP with erythema but no exudates, nasal congestion and edema, clear lung exam. Tylenol/ibuprofen as needed for fever.   2. Patient with nontender 30m ulcer/skin change without erythema on penile shaft. Patient states he has "discomfort" with urination but no burning. Recent unprotected intercourse with partner who has expressed concerns for STD's. UA obtained which was unremarkable. G&C sent, prophylactic ABX given to cover for G&C. Syphilis and HIV blood work sent and patient informed that he will be called if results are positive. No intercourse until symptoms are 100% resolved. PCP follow up.  I personally performed the services described in this documentation, which was scribed in  my presence. The recorded information has been reviewed and is accurate.     JMcleod LorisWard, PA-C 04/28/15 2107  EDaleen Bo MD 04/29/15 [phone]

## 2015-04-28 NOTE — ED Notes (Signed)
Pt states he has had some redness/itching in left eye and is "stuck together" when he wakes up. Pt also concerned that he has had some blood in his urine and burning/frequency. Pt states he also has a new rash that started with these symptoms on his penis.

## 2015-04-28 NOTE — ED Notes (Signed)
Pt states understanding of discharge instructions, prescriptions and process for notifying him of test results.

## 2015-04-28 NOTE — Discharge Instructions (Signed)
1. Medications: Use eye drops as directed, wash your hands regularly, take over the counter Claritin or Zyrtec, continue usual home medications 2. Treatment: rest, drink plenty of fluids, use a condom with every sexual encounter 3. Follow Up: Please follow up with your primary doctor in 3 days for discussion of your diagnoses and further evaluation after today's visit; if you do not have a primary care doctor use the phone number provided to find one; Please return to the ER for worsening symptoms, high fevers or persistent vomiting.  You have been tested for HIV, syphilis, chlamydia and gonorrhea. These results will be available in approximately 3 days. You will be notified if they are positive.   It is very important to practice safe sex and use condoms when sexually active. If your results are positive you need to notify all sexual partners so they can be treated as well. The website https://garcia.net/ can be used to send anonymous text messages or emails to alert sexual contacts.    SEEK IMMEDIATE MEDICAL CARE IF:  You develop an oral temperature above 102 F (38.9 C), not controlled by medications or lasting more than 2 days.  You develop an increase in pain.  You develop any type of abnormal discharge.

## 2015-04-29 LAB — HIV ANTIBODY (ROUTINE TESTING W REFLEX): HIV Screen 4th Generation wRfx: NONREACTIVE

## 2015-04-29 LAB — RPR: RPR: NONREACTIVE

## 2015-04-30 ENCOUNTER — Other Ambulatory Visit: Payer: Self-pay | Admitting: Nurse Practitioner

## 2015-04-30 DIAGNOSIS — R319 Hematuria, unspecified: Secondary | ICD-10-CM

## 2015-04-30 LAB — GC/CHLAMYDIA PROBE AMP (~~LOC~~) NOT AT ARMC
Chlamydia: NEGATIVE
NEISSERIA GONORRHEA: NEGATIVE

## 2015-05-08 ENCOUNTER — Inpatient Hospital Stay: Admission: RE | Admit: 2015-05-08 | Payer: BLUE CROSS/BLUE SHIELD | Source: Ambulatory Visit

## 2015-05-11 ENCOUNTER — Ambulatory Visit (HOSPITAL_COMMUNITY)
Admission: RE | Admit: 2015-05-11 | Discharge: 2015-05-11 | Disposition: A | Discharge status: Home / Self Care | Payer: BLUE CROSS/BLUE SHIELD | Source: Ambulatory Visit | Attending: Gastroenterology | Admitting: Gastroenterology

## 2015-05-11 DIAGNOSIS — K5090 Crohn's disease, unspecified, without complications: Secondary | ICD-10-CM | POA: Diagnosis present

## 2015-05-11 MED ORDER — METHYLPREDNISOLONE SODIUM SUCC 125 MG IJ SOLR
40.0000 mg | Freq: Once | INTRAMUSCULAR | Status: AC
  Administered 2015-05-11: 40 mg via INTRAVENOUS
  Filled 2015-05-11: qty 2

## 2015-05-11 MED ORDER — SODIUM CHLORIDE 0.9 % IV SOLN
7.5000 mg/kg | INTRAVENOUS | Status: DC
  Administered 2015-05-11: 800 mg via INTRAVENOUS
  Filled 2015-05-11: qty 80

## 2015-05-11 MED ORDER — ACETAMINOPHEN 325 MG PO TABS
650.0000 mg | ORAL_TABLET | Freq: Once | ORAL | Status: AC
  Administered 2015-05-11: 650 mg via ORAL
  Filled 2015-05-11: qty 2

## 2015-05-11 MED ORDER — DIPHENHYDRAMINE HCL 25 MG PO CAPS
50.0000 mg | ORAL_CAPSULE | Freq: Once | ORAL | Status: AC
  Administered 2015-05-11: 50 mg via ORAL
  Filled 2015-05-11: qty 2

## 2015-05-11 NOTE — Progress Notes (Signed)
   Dr. Bosie Clos  Associated Diagnosis: Chron's disease  Procedure Note: IV started, pre medications given per order, Remicade infused per order, IV discontinued   Condition During Procedure: Patient stable, no signs/symptoms of reaction   Condition at Discharge: Patient stable, ambulatory at discharge

## 2015-07-09 ENCOUNTER — Telehealth: Payer: Self-pay | Admitting: Hematology

## 2015-07-09 NOTE — Telephone Encounter (Signed)
Patient called in to advise he is currently back in [place], [place] for the summer and will not get his Remicaide infusion here again until he returns in the fall.  Patient was asking for a "referral" to a provider or infusion center in that area, and asking questions about his insurance.  Patient directed to contact his PCP for this information.  Patient verbalizes understanding, and will call [facility] back once he returns to the area.

## 2015-08-31 ENCOUNTER — Ambulatory Visit (HOSPITAL_COMMUNITY)
Admission: RE | Admit: 2015-08-31 | Discharge: 2015-08-31 | Disposition: A | Discharge status: Home / Self Care | Payer: BLUE CROSS/BLUE SHIELD | Source: Ambulatory Visit | Attending: Emergency Medicine | Admitting: Emergency Medicine

## 2015-08-31 ENCOUNTER — Emergency Department (HOSPITAL_COMMUNITY): Payer: BLUE CROSS/BLUE SHIELD

## 2015-08-31 ENCOUNTER — Encounter (HOSPITAL_COMMUNITY): Payer: Self-pay

## 2015-08-31 ENCOUNTER — Emergency Department (HOSPITAL_COMMUNITY)
Admission: EM | Admit: 2015-08-31 | Discharge: 2015-08-31 | Disposition: A | Discharge status: Home / Self Care | Payer: BLUE CROSS/BLUE SHIELD | Attending: Emergency Medicine | Admitting: Emergency Medicine

## 2015-08-31 DIAGNOSIS — R609 Edema, unspecified: Secondary | ICD-10-CM | POA: Insufficient documentation

## 2015-08-31 DIAGNOSIS — Z79899 Other long term (current) drug therapy: Secondary | ICD-10-CM | POA: Insufficient documentation

## 2015-08-31 DIAGNOSIS — Z86718 Personal history of other venous thrombosis and embolism: Secondary | ICD-10-CM | POA: Diagnosis not present

## 2015-08-31 DIAGNOSIS — M79609 Pain in unspecified limb: Secondary | ICD-10-CM | POA: Diagnosis not present

## 2015-08-31 DIAGNOSIS — M25461 Effusion, right knee: Secondary | ICD-10-CM

## 2015-08-31 HISTORY — DX: Acute embolism and thrombosis of unspecified deep veins of unspecified lower extremity: I82.409

## 2015-08-31 LAB — URINALYSIS, ROUTINE W REFLEX MICROSCOPIC
BILIRUBIN URINE: NEGATIVE
GLUCOSE, UA: NEGATIVE mg/dL
HGB URINE DIPSTICK: NEGATIVE
KETONES UR: NEGATIVE mg/dL
Leukocytes, UA: NEGATIVE
Nitrite: NEGATIVE
PH: 6 (ref 5.0–8.0)
Protein, ur: 30 mg/dL — AB
Specific Gravity, Urine: 1.025 (ref 1.005–1.030)

## 2015-08-31 LAB — COMPREHENSIVE METABOLIC PANEL
ALBUMIN: 3.1 g/dL — AB (ref 3.5–5.0)
ALK PHOS: 71 U/L (ref 38–126)
ALT: 35 U/L (ref 17–63)
AST: 34 U/L (ref 15–41)
Anion gap: 6 (ref 5–15)
BILIRUBIN TOTAL: 0.5 mg/dL (ref 0.3–1.2)
BUN: 14 mg/dL (ref 6–20)
CO2: 27 mmol/L (ref 22–32)
Calcium: 8.7 mg/dL — ABNORMAL LOW (ref 8.9–10.3)
Chloride: 101 mmol/L (ref 101–111)
Creatinine, Ser: 1.16 mg/dL (ref 0.61–1.24)
GFR calc Af Amer: >60 mL/min (ref >60)
GFR calc non Af Amer: >60 mL/min (ref >60)
GLUCOSE: 89 mg/dL (ref 65–99)
POTASSIUM: 3.9 mmol/L (ref 3.5–5.1)
Sodium: 134 mmol/L — ABNORMAL LOW (ref 135–145)
TOTAL PROTEIN: 8.1 g/dL (ref 6.5–8.1)

## 2015-08-31 LAB — CBC WITH DIFFERENTIAL/PLATELET
BASOS ABS: 0 10*3/uL (ref 0.0–0.1)
BASOS PCT: 0 %
Eosinophils Absolute: 0.1 10*3/uL (ref 0.0–0.7)
Eosinophils Relative: 1 %
HEMATOCRIT: 30.9 % — AB (ref 39.0–52.0)
Hemoglobin: 9 g/dL — ABNORMAL LOW (ref 13.0–17.0)
LYMPHS ABS: 2 10*3/uL (ref 0.7–4.0)
Lymphocytes Relative: 23 %
MCH: 19.4 pg — AB (ref 26.0–34.0)
MCHC: 29.1 g/dL — AB (ref 30.0–36.0)
MCV: 66.5 fL — AB (ref 78.0–100.0)
MONOS PCT: 15 %
Monocytes Absolute: 1.3 10*3/uL — ABNORMAL HIGH (ref 0.1–1.0)
NEUTROS ABS: 5.2 10*3/uL (ref 1.7–7.7)
Neutrophils Relative %: 61 %
Platelets: 400 10*3/uL (ref 150–400)
RBC: 4.65 MIL/uL (ref 4.22–5.81)
RDW: 22.5 % — AB (ref 11.5–15.5)
WBC: 8.6 10*3/uL (ref 4.0–10.5)

## 2015-08-31 LAB — URINE MICROSCOPIC-ADD ON: RBC / HPF: NONE SEEN RBC/hpf (ref 0–5)

## 2015-08-31 LAB — I-STAT CG4 LACTIC ACID, ED: Lactic Acid, Venous: 1.15 mmol/L (ref 0.5–1.9)

## 2015-08-31 MED ORDER — CEPHALEXIN 250 MG PO CAPS
500.0000 mg | ORAL_CAPSULE | Freq: Once | ORAL | Status: AC
  Administered 2015-08-31: 500 mg via ORAL
  Filled 2015-08-31: qty 2

## 2015-08-31 MED ORDER — DOXYCYCLINE HYCLATE 100 MG PO CAPS: 100.0000 mg | ORAL_CAPSULE | Freq: Two times a day (BID) | ORAL | 0 refills | Status: DC

## 2015-08-31 MED ORDER — CEPHALEXIN 500 MG PO CAPS: 500.0000 mg | ORAL_CAPSULE | Freq: Four times a day (QID) | ORAL | 0 refills | Status: DC

## 2015-08-31 MED ORDER — KETOROLAC TROMETHAMINE 60 MG/2ML IM SOLN
60.0000 mg | Freq: Once | INTRAMUSCULAR | Status: AC
  Administered 2015-08-31: 60 mg via INTRAMUSCULAR
  Filled 2015-08-31: qty 2

## 2015-08-31 MED ORDER — TRAMADOL HCL 50 MG PO TABS: 50.0000 mg | ORAL_TABLET | Freq: Four times a day (QID) | ORAL | 0 refills | Status: DC | PRN

## 2015-08-31 MED ORDER — DOXYCYCLINE HYCLATE 100 MG PO TABS
100.0000 mg | ORAL_TABLET | Freq: Once | ORAL | Status: AC
  Administered 2015-08-31: 100 mg via ORAL
  Filled 2015-08-31: qty 1

## 2015-08-31 MED ORDER — MELOXICAM 7.5 MG PO TABS: 7.5000 mg | ORAL_TABLET | Freq: Every day | ORAL | 0 refills | Status: DC

## 2015-08-31 NOTE — ED Triage Notes (Signed)
Pt states that he has a hx of chrons and was hospitalized about a month ago. C/o R knee pain, knee is swollen and very warm. Pain has been going on for about 4 weeks. Fevers for the past two days.

## 2015-08-31 NOTE — ED Triage Notes (Signed)
Pt has known DVT in the past.

## 2015-08-31 NOTE — ED Provider Notes (Signed)
[facility]-EMERGENCY DEPT Provider Note   CSN: [id number] Arrival date & time: 08/31/15  0105     History   Chief Complaint Chief Complaint  Patient presents with  . Knee Pain  . Other    chrons    HPI Tom Ferguson is a 25 y.o. male.  The history is provided by the patient.  Leg Pain   This is a chronic problem. The current episode started more than 1 week ago (since early June). The problem occurs constantly. The problem has been gradually worsening. The pain is present in the right knee. The quality of the pain is described as aching. The pain is moderate. Pertinent negatives include no numbness, no tingling and no itching. The symptoms are aggravated by activity. He has tried OTC pain medications for the symptoms. The treatment provided no relief. Family history is significant for no rheumatoid arthritis.    Past Medical History:  Diagnosis Date  . Crohn's disease (HCC)   . DVT (deep venous thrombosis) (HCC)     There are no active problems to display for this patient.   Past Surgical History:  Procedure Laterality Date  . COLON SURGERY     colostomy  . COLOSTOMY         Home Medications    Prior to Admission medications   Medication Sig Start Date End Date Taking? Authorizing Provider  acetaminophen (TYLENOL) 325 MG tablet Take 650 mg by mouth every 6 (six) hours as needed for headache.    Historical Provider, MD  HYDROcodone-acetaminophen (NORCO/VICODIN) 5-325 MG tablet Take 1-2 tablets by mouth every 6 (six) hours as needed. 03/09/15   Gwyneth Sprout, MD  ibuprofen (ADVIL,MOTRIN) 200 MG tablet Take 200 mg by mouth every 6 (six) hours as needed for headache.    Historical Provider, MD  trimethoprim-polymyxin b (POLYTRIM) ophthalmic solution Place 1 drop into the left eye every 4 (four) hours. 04/28/15   Chase Picket Ward, PA-C    Family History No family history on file.  Social History Social History  Substance Use Topics  . Smoking status: Never Smoker  .  Smokeless tobacco: Never Used  . Alcohol use Yes     Allergies   Review of patient's allergies indicates no known allergies.   Review of Systems Review of Systems  Constitutional: Negative for fever.  HENT: Negative for drooling.   Respiratory: Negative for shortness of breath.   Cardiovascular: Negative for chest pain and palpitations.  Musculoskeletal: Positive for joint swelling. Negative for back pain, neck pain and neck stiffness.  Skin: Negative for itching.  Neurological: Negative for tingling and numbness.  All other systems reviewed and are negative.    Physical Exam Updated Vital Signs BP 110/63   Pulse 79   Temp 99.6 F (37.6 C) (Oral)   Resp 16   Ht 6\' 2"  (1.88 m)   Wt 235 lb (106.6 kg)   SpO2 100%   BMI 30.17 kg/m   Physical Exam  Constitutional: He is oriented to person, place, and time. He appears well-developed and well-nourished. No distress.  HENT:  Head: Normocephalic and atraumatic.  Nose: Nose normal.  Eyes: EOM are normal. Pupils are equal, round, and reactive to light.  Neck: Normal range of motion. Neck supple.  Cardiovascular: Normal rate and regular rhythm.  Exam reveals no friction rub.   Pulmonary/Chest: Effort normal and breath sounds normal.  Abdominal: Soft. Bowel sounds are normal. There is no tenderness.  Musculoskeletal: Normal range of motion.  Right knee: He exhibits swelling, effusion and erythema. He exhibits normal alignment, no LCL laxity, no bony tenderness, normal meniscus and no MCL laxity. No tenderness found. No medial joint line, no lateral joint line, no MCL, no LCL and no patellar tendon tenderness noted.       Left knee: Normal.       Right ankle: Normal.       Left ankle: Normal.       Right upper leg: Normal.       Left upper leg: Normal.       Right lower leg: Normal. He exhibits no tenderness, no bony tenderness and no swelling.  Neurological: He is alert and oriented to person, place, and time. He has  normal reflexes.  Skin: Skin is warm and dry. Capillary refill takes less than 2 seconds.  Psychiatric: He has a normal mood and affect.     ED Treatments / Results  Labs (all labs ordered are listed, but only abnormal results are displayed) Labs Reviewed  COMPREHENSIVE METABOLIC PANEL - Abnormal; Notable for the following:       Result Value   Sodium 134 (*)    Calcium 8.7 (*)    Albumin 3.1 (*)    All other components within normal limits  CBC WITH DIFFERENTIAL/PLATELET - Abnormal; Notable for the following:    Hemoglobin 9.0 (*)    HCT 30.9 (*)    MCV 66.5 (*)    MCH 19.4 (*)    MCHC 29.1 (*)    RDW 22.5 (*)    Monocytes Absolute 1.3 (*)    All other components within normal limits  URINALYSIS, ROUTINE W REFLEX MICROSCOPIC (NOT AT [facility]) - Abnormal; Notable for the following:    Protein, ur 30 (*)    All other components within normal limits  URINE MICROSCOPIC-ADD ON - Abnormal; Notable for the following:    Squamous Epithelial / LPF 0-5 (*)    Bacteria, UA RARE (*)    All other components within normal limits  URINE CULTURE  I-STAT CG4 LACTIC ACID, ED    EKG  EKG Interpretation None       Radiology Dg Knee Complete 4 Views Right  Result Date: 08/31/2015 CLINICAL DATA:  Swelling and edema of the right knee for 4 weeks. No reported injury. EXAM: RIGHT KNEE - COMPLETE 4+ VIEW COMPARISON:  None. FINDINGS: Large right knee effusion. No evidence of acute fracture or dislocation. No focal bone lesion or bone destruction. Bone cortex appears intact. IMPRESSION: Large right knee effusion.  No acute bony abnormalities. Electronically Signed   By: Burman NievesWilliam  Stevens M.D.   On: 08/31/2015 01:40    Procedures Procedures (including critical care time)  Medications Ordered in ED Medications  ketorolac (TORADOL) injection 60 mg (60 mg Intramuscular Given 08/31/15 0450)  doxycycline (VIBRA-TABS) tablet 100 mg (100 mg Oral Given 08/31/15 0450)  cephALEXin (KEFLEX) capsule 500 mg  (500 mg Oral Given 08/31/15 0450)     Initial Impression / Assessment and Plan / ED Course  I have reviewed the triage vital signs and the nursing notes.  Pertinent labs & imaging results that were available during my care of the patient were reviewed by me and considered in my medical decision making (see chart for details).  Clinical Course    Vitals:   08/31/15 0415 08/31/15 0430  BP: 122/77 110/63  Pulse: 79 79  Resp: 12 16  Temp:       Final Clinical Impressions(s) / ED Diagnoses  Final diagnoses:  None   Case d/w Dr. Eulah Pont.  Can get tapped as an outpatient given time course.  Ace wrap and have patient call in the am.   New Prescriptions New Prescriptions   No medications on file   Will set up outpatient doppler to exclude other pathology.   Will start ABX as skin is red and warm overlying the knee and have patient follow up with orthopedics.    All questions answered to patient's satisfaction. Based on history and exam patient has been appropriately medically screened and emergency conditions excluded. Patient is stable for discharge at this time. Follow up with your PMDfor recheck in 2 daysand strict return precautions given.    Cy Blamer, MD 08/31/15 [phone]

## 2015-08-31 NOTE — Progress Notes (Signed)
*  Preliminary Results* Right lower extremity venous duplex completed. Right lower extremity is negative for deep vein thrombosis. There is no evidence of right Baker's cyst.  08/31/2015 8:59 AM  Gertie Fey, BS, RVT, RDCS, RDMS

## 2015-09-01 LAB — URINE CULTURE: Culture: NO GROWTH

## 2015-09-07 ENCOUNTER — Ambulatory Visit (HOSPITAL_COMMUNITY)
Admission: RE | Admit: 2015-09-07 | Discharge: 2015-09-07 | Disposition: A | Discharge status: Home / Self Care | Payer: BLUE CROSS/BLUE SHIELD | Source: Ambulatory Visit | Attending: Gastroenterology | Admitting: Gastroenterology

## 2015-09-07 ENCOUNTER — Encounter (HOSPITAL_COMMUNITY): Payer: Self-pay | Admitting: *Deleted

## 2015-09-07 DIAGNOSIS — K5090 Crohn's disease, unspecified, without complications: Secondary | ICD-10-CM | POA: Diagnosis present

## 2015-09-07 MED ORDER — DIPHENHYDRAMINE HCL 25 MG PO CAPS
50.0000 mg | ORAL_CAPSULE | Freq: Once | ORAL | Status: AC
Start: 2015-09-07 — End: 2015-09-07
  Administered 2015-09-07: 50 mg via ORAL
  Filled 2015-09-07: qty 2

## 2015-09-07 MED ORDER — SODIUM CHLORIDE 0.9 % IV SOLN
7.5000 mg/kg | Freq: Once | INTRAVENOUS | Status: AC
  Administered 2015-09-07: 800 mg via INTRAVENOUS
  Filled 2015-09-07: qty 80

## 2015-09-07 MED ORDER — METHYLPREDNISOLONE SODIUM SUCC 125 MG IJ SOLR
40.0000 mg | Freq: Once | INTRAMUSCULAR | Status: AC
  Administered 2015-09-07: 40 mg via INTRAVENOUS
  Filled 2015-09-07: qty 2

## 2015-09-07 MED ORDER — ACETAMINOPHEN 325 MG PO TABS
650.0000 mg | ORAL_TABLET | Freq: Once | ORAL | Status: AC
Start: 2015-09-07 — End: 2015-09-07
  Administered 2015-09-07: 650 mg via ORAL
  Filled 2015-09-07: qty 2

## 2015-09-07 NOTE — Discharge Instructions (Signed)
Infusion of Remicade 7.5 mg/kg

## 2015-09-07 NOTE — Progress Notes (Signed)
Patient ID: Tom Ferguson, male   DOB: March 06, 1990, 25 y.o.   MRN: [id number] Provider: Charlott RakesVincent Schooler MD  Associated Diagnosis: Crohn's Disease  Procedure: Infusion of Remicade 7.5 mg/kg via PIV as ordered and directed by pharmacy. Pre meds of Solumedrol 40 mg IV, Benadryl 50 mg po and Tylenol 650 mg po.  Tolerated infusion well. No reactions. Watched patient per protocol after infusion. Went over discharge instructions and copy given to patient. Alert, ambulatory and oriented at time of discharge. Discharged home via ride.

## 2015-10-05 ENCOUNTER — Encounter: Payer: Self-pay | Admitting: Hematology

## 2015-10-05 ENCOUNTER — Telehealth: Payer: Self-pay | Admitting: Hematology

## 2015-10-05 NOTE — Telephone Encounter (Signed)
Pt confirmed appt, completed intake, faxed referring provider appt date/time

## 2015-10-17 ENCOUNTER — Telehealth: Payer: Self-pay | Admitting: Hematology

## 2015-10-18 ENCOUNTER — Ambulatory Visit (HOSPITAL_COMMUNITY)
Admission: RE | Admit: 2015-10-18 | Discharge: 2015-10-18 | Disposition: A | Discharge status: Home / Self Care | Payer: BLUE CROSS/BLUE SHIELD | Source: Ambulatory Visit | Attending: Gastroenterology | Admitting: Gastroenterology

## 2015-10-18 DIAGNOSIS — K5090 Crohn's disease, unspecified, without complications: Secondary | ICD-10-CM | POA: Insufficient documentation

## 2015-10-18 MED ORDER — ACETAMINOPHEN 325 MG PO TABS
650.0000 mg | ORAL_TABLET | Freq: Once | ORAL | Status: AC
  Administered 2015-10-18: 650 mg via ORAL
  Filled 2015-10-18: qty 2

## 2015-10-18 MED ORDER — DIPHENHYDRAMINE HCL 25 MG PO CAPS
50.0000 mg | ORAL_CAPSULE | Freq: Once | ORAL | Status: AC
  Administered 2015-10-18: 50 mg via ORAL
  Filled 2015-10-18: qty 2

## 2015-10-18 MED ORDER — SODIUM CHLORIDE 0.9 % IV SOLN
7.5000 mg/kg | Freq: Once | INTRAVENOUS | Status: AC
  Administered 2015-10-18: 900 mg via INTRAVENOUS
  Filled 2015-10-18: qty 90

## 2015-10-18 MED ORDER — INFLIXIMAB 100 MG IV SOLR: 7.0000 mg/kg | Freq: Once | INTRAVENOUS | Status: DC

## 2015-10-18 MED ORDER — METHYLPREDNISOLONE SODIUM SUCC 125 MG IJ SOLR
40.0000 mg | Freq: Once | INTRAMUSCULAR | Status: AC
  Administered 2015-10-18: 40 mg via INTRAVENOUS
  Filled 2015-10-18: qty 2

## 2015-10-18 NOTE — Discharge Instructions (Signed)
Today, you received an infusion of Remicade.  Please see Dr. Bosie Clos with any questions about follow-up.

## 2015-10-18 NOTE — Procedures (Signed)
[facility]  Procedure Note  Tom Ferguson BTY:[id number] DOB: 12/07/1990 DOA: 10/18/2015   PCP: Shirley Friar., MD   Associated Diagnosis: Crohn's disease   Procedure Note: IV infusion of Remicade   Condition During Procedure: Pt tolerated well  Condition at Discharge: No complications noted   Bluford Kaufmann, RN  [facility]

## 2015-10-18 NOTE — Telephone Encounter (Signed)
Telephone encounter opened in error.  Chart review ahead for infusion scheduled on 10/18/2015

## 2015-10-19 ENCOUNTER — Encounter: Payer: BLUE CROSS/BLUE SHIELD | Admitting: Hematology

## 2015-12-05 ENCOUNTER — Ambulatory Visit (HOSPITAL_COMMUNITY)
Admission: RE | Admit: 2015-12-05 | Discharge: 2015-12-05 | Disposition: A | Discharge status: Home / Self Care | Payer: BLUE CROSS/BLUE SHIELD | Source: Ambulatory Visit | Attending: Gastroenterology | Admitting: Gastroenterology

## 2015-12-05 DIAGNOSIS — K5090 Crohn's disease, unspecified, without complications: Secondary | ICD-10-CM | POA: Insufficient documentation

## 2015-12-05 MED ORDER — DIPHENHYDRAMINE HCL 25 MG PO CAPS
50.0000 mg | ORAL_CAPSULE | Freq: Once | ORAL | Status: AC
  Administered 2015-12-05: 50 mg via ORAL
  Filled 2015-12-05: qty 2

## 2015-12-05 MED ORDER — METHYLPREDNISOLONE SODIUM SUCC 125 MG IJ SOLR
40.0000 mg | Freq: Once | INTRAMUSCULAR | Status: AC
  Administered 2015-12-05: 40 mg via INTRAVENOUS
  Filled 2015-12-05: qty 2

## 2015-12-05 MED ORDER — INFLIXIMAB 100 MG IV SOLR
7.5000 mg/kg | Freq: Once | INTRAVENOUS | Status: AC
  Administered 2015-12-05: 900 mg via INTRAVENOUS
  Filled 2015-12-05: qty 90

## 2015-12-05 MED ORDER — ACETAMINOPHEN 325 MG PO TABS
650.0000 mg | ORAL_TABLET | Freq: Once | ORAL | Status: AC
  Administered 2015-12-05: 650 mg via ORAL
  Filled 2015-12-05: qty 2

## 2015-12-05 NOTE — Progress Notes (Signed)
PCP: SCHOOLER,VINCENT C., MD   Associated Diagnosis: Crohn's disease      Procedure Note: IV infusion of Remicade   Condition During Procedure: Pt tolerated well  Condition at Discharge: No complications noted

## 2016-01-18 ENCOUNTER — Ambulatory Visit (HOSPITAL_COMMUNITY)
Admission: RE | Admit: 2016-01-18 | Discharge: 2016-01-18 | Disposition: A | Discharge status: Home / Self Care | Payer: BLUE CROSS/BLUE SHIELD | Source: Ambulatory Visit | Attending: Gastroenterology | Admitting: Gastroenterology

## 2016-01-18 DIAGNOSIS — K5090 Crohn's disease, unspecified, without complications: Secondary | ICD-10-CM | POA: Insufficient documentation

## 2016-01-18 MED ORDER — SODIUM CHLORIDE 0.9 % IV SOLN
7.5000 mg/kg | Freq: Once | INTRAVENOUS | Status: AC
  Administered 2016-01-18: 900 mg via INTRAVENOUS
  Filled 2016-01-18: qty 90

## 2016-01-18 MED ORDER — ACETAMINOPHEN 325 MG PO TABS
650.0000 mg | ORAL_TABLET | Freq: Once | ORAL | Status: AC
  Administered 2016-01-18: 650 mg via ORAL
  Filled 2016-01-18: qty 2

## 2016-01-18 MED ORDER — DIPHENHYDRAMINE HCL 25 MG PO CAPS
50.0000 mg | ORAL_CAPSULE | Freq: Once | ORAL | Status: AC
  Administered 2016-01-18: 50 mg via ORAL
  Filled 2016-01-18: qty 2

## 2016-01-18 MED ORDER — METHYLPREDNISOLONE SODIUM SUCC 125 MG IJ SOLR
40.0000 mg | Freq: Once | INTRAMUSCULAR | Status: AC
  Administered 2016-01-18: 40 mg via INTRAVENOUS
  Filled 2016-01-18: qty 2

## 2016-01-18 NOTE — Progress Notes (Signed)
PCP: SCHOOLER,VINCENT C., MD  Associated Diagnosis: Crohn's disease  Procedure Note: IV infusion of Remicade   Condition During Procedure: Pt tolerated well  Condition at Discharge: No complications noted. Discharge instructions given to patient with verbal understanding. Pt alert, oriented and ambulatory at discharge.

## 2016-03-14 ENCOUNTER — Ambulatory Visit (HOSPITAL_COMMUNITY)
Admission: RE | Admit: 2016-03-14 | Discharge: 2016-03-14 | Disposition: A | Discharge status: Home / Self Care | Payer: BLUE CROSS/BLUE SHIELD | Source: Ambulatory Visit | Attending: Gastroenterology | Admitting: Gastroenterology

## 2016-03-14 ENCOUNTER — Encounter (HOSPITAL_COMMUNITY): Payer: Self-pay | Admitting: *Deleted

## 2016-03-14 ENCOUNTER — Encounter (HOSPITAL_COMMUNITY): Payer: Self-pay

## 2016-03-14 DIAGNOSIS — K5090 Crohn's disease, unspecified, without complications: Secondary | ICD-10-CM | POA: Insufficient documentation

## 2016-03-14 MED ORDER — METHYLPREDNISOLONE SODIUM SUCC 125 MG IJ SOLR
40.0000 mg | Freq: Once | INTRAMUSCULAR | Status: DC
  Filled 2016-03-14: qty 2

## 2016-03-14 MED ORDER — DIPHENHYDRAMINE HCL 25 MG PO CAPS
50.0000 mg | ORAL_CAPSULE | Freq: Once | ORAL | Status: AC
  Administered 2016-03-14: 50 mg via ORAL
  Filled 2016-03-14: qty 2

## 2016-03-14 MED ORDER — SODIUM CHLORIDE 0.9 % IV SOLN
7.5000 mg/kg | INTRAVENOUS | Status: DC
  Administered 2016-03-14: 900 mg via INTRAVENOUS
  Filled 2016-03-14: qty 90

## 2016-03-14 MED ORDER — ACETAMINOPHEN 325 MG PO TABS
650.0000 mg | ORAL_TABLET | Freq: Once | ORAL | Status: AC
  Administered 2016-03-14: 650 mg via ORAL
  Filled 2016-03-14: qty 2

## 2016-03-14 NOTE — Discharge Instructions (Signed)
Infliximab injection What is this medicine? INFLIXIMAB (in FLIX i mab) is used to treat Crohn's disease and ulcerative colitis. It is also used to treat ankylosing spondylitis, plaque psoriasis, and some forms of arthritis. This medicine may be used for other purposes; ask your health care provider or pharmacist if you have questions. COMMON BRAND NAME(S): INFLECTRA, Remicade, RENFLEXIS What should I tell my health care provider before I take this medicine? They need to know if you have any of these conditions: -cancer -current or past resident of [place] -diabetes -exposure to tuberculosis -Guillain-Barre syndrome -heart failure -hepatitis or liver disease -immune system problems -infection -lung or breathing disease, like COPD -multiple sclerosis -receiving phototherapy for the skin -seizure disorder -an unusual or allergic reaction to infliximab, mouse proteins, other medicines, foods, dyes, or preservatives -pregnant or trying to get pregnant -breast-feeding How should I use this medicine? This medicine is for injection into a vein. It is usually given by a health care professional in a hospital or clinic setting. A special MedGuide will be given to you by the pharmacist with each prescription and refill. Be sure to read this information carefully each time. Talk to your pediatrician regarding the use of this medicine in children. While this drug may be prescribed for children as young as 6 years of age for selected conditions, precautions do apply. Overdosage: If you think you have taken too much of this medicine contact a poison control center or emergency room at once. NOTE: This medicine is only for you. Do not share this medicine with others. What if I miss a dose? It is important not to miss your dose. Call your doctor or health care professional if you are unable to keep an appointment. What may interact with this medicine? Do not take this  medicine with any of the following medications: -biologic medicines such as abatacept, adalimumab, anakinra, certolizumab, etanercept, golimumab, rituximab, secukinumab, tocilizumab, tofactinib, ustekinumab -live vaccines This list may not describe all possible interactions. Give your health care provider a list of all the medicines, herbs, non-prescription drugs, or dietary supplements you use. Also tell them if you smoke, drink alcohol, or use illegal drugs. Some items may interact with your medicine. What should I watch for while using this medicine? Your condition will be monitored carefully while you are receiving this medicine. Visit your doctor or health care professional for regular checks on your progress. You may need blood work done while you are taking this medicine. Before beginning therapy, your doctor may do a test to see if you have been exposed to tuberculosis. Call your doctor or health care professional for advice if you get a fever, chills or sore throat, or other symptoms of a cold or flu. Do not treat yourself. This drug decreases your body's ability to fight infections. Try to avoid being around people who are sick. This medicine may make the symptoms of heart failure worse in some patients. If you notice symptoms such as increased shortness of breath or swelling of the ankles or legs, contact your health care provider right away. If you are going to have surgery or dental work, tell your health care professional or dentist that you have received this medicine. If you take this medicine for plaque psoriasis, stay out of the sun. If you cannot avoid being in the sun, wear protective clothing and use sunscreen. Do not use sun lamps or tanning beds/booths. Talk to your doctor about your risk of cancer. You may be   more at risk for certain types of cancers if you take this medicine. What side effects may I notice from receiving this medicine? Side effects that you should report to your  doctor or health care professional as soon as possible: -allergic reactions like skin rash, itching or hives, swelling of the face, lips, or tongue -breathing problems -changes in vision -chest pain -fever or chills, usually related to the infusion -joint pain -pain, tingling, numbness in the hands or feet -redness, blistering, peeling or loosening of the skin, including inside the mouth -seizures -signs of infection - fever or chills, cough, sore throat, flu-like symptoms, pain or difficulty passing urine -signs and symptoms of liver injury like dark yellow or brown urine; general ill feeling; light-colored stools; loss of appetite; nausea; right upper belly pain; unusually weak or tired; yellowing of the eyes or skin -signs and symptoms of a stroke like changes in vision; confusion; trouble speaking or understanding; severe headaches; sudden numbness or weakness of the face, arm or leg; trouble walking; dizziness; loss of balance or coordination -swelling of the ankles, feet, or hands -swollen lymph nodes in the neck, underarm, or groin areas -unusual bleeding or bruising -unusually weak or tired Side effects that usually do not require medical attention (report to your doctor or health care professional if they continue or are bothersome): -headache -nausea -stomach pain -upset stomach This list may not describe all possible side effects. Call your doctor for medical advice about side effects. You may report side effects to FDA at 1-800-FDA-1088. Where should I keep my medicine? This drug is given in a hospital or clinic and will not be stored at home. NOTE: This sheet is a summary. It may not cover all possible information. If you have questions about this medicine, talk to your doctor, pharmacist, or health care provider.  2018 Elsevier/Gold Standard (2016-01-30 13:45:32)  

## 2016-03-14 NOTE — Progress Notes (Signed)
PCP: SCHOOLER,VINCENT C., MD  Associated Diagnosis: Crohn's disease  Procedure Note: IV infusion of Remicade   Condition During Procedure: Pt tolerated well  Condition at Discharge: No complications noted. Discharge instructions given to patient with verbal understanding. Pt alert, oriented and ambulatory at discharge.  

## 2016-05-14 ENCOUNTER — Emergency Department (HOSPITAL_COMMUNITY)
Admission: EM | Admit: 2016-05-14 | Discharge: 2016-05-14 | Disposition: A | Discharge status: Home / Self Care | Payer: BLUE CROSS/BLUE SHIELD | Attending: Physician Assistant | Admitting: Physician Assistant

## 2016-05-14 ENCOUNTER — Encounter (HOSPITAL_COMMUNITY): Payer: Self-pay | Admitting: *Deleted

## 2016-05-14 DIAGNOSIS — Z202 Contact with and (suspected) exposure to infections with a predominantly sexual mode of transmission: Secondary | ICD-10-CM | POA: Insufficient documentation

## 2016-05-14 NOTE — ED Provider Notes (Signed)
[facility] DEPT Provider Note   CSN: [id number] Arrival date & time: 05/14/16  1231  By signing my name below, I, Tom Ferguson, attest that this documentation has been prepared under the direction and in the presence of Tom Devoid, PA-C.  Electronically Signed: Levester Ferguson, Scribe. 05/14/2016. 1:30 PM.  History   Chief Complaint Chief Complaint  Patient presents with  . Exposure to STD   Tom Ferguson is a 26 y.o. male who presents to the Emergency Department s/p a herpes exposure last night. Pt's sexual partner informed him last night and is asymptomatic. 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 He does not consistently use condoms during intercourse.  He had noticed a non painful red rash x2 weeks ago on the underside of his penile shaft After shaving his genital region that resolved without any intervention.   Pt denies experiencing any other acute sx, including dysuria, hematuria, abnormal penile discharge, fevers, nausea, vomiting or abdominal pain.   The history is provided by the patient. No language interpreter was used.    Past Medical History:  Diagnosis Date  . Crohn's disease ([place])   . DVT (deep venous thrombosis) ([place])    There are no active problems to display for this patient.  Past Surgical History:  Procedure Laterality Date  . COLON SURGERY     colostomy  . COLOSTOMY       Home Medications    Prior to Admission medications   Medication Sig Start Date End Date Taking? Authorizing Provider  acetaminophen (TYLENOL) 325 MG tablet Take 650 mg by mouth every 6 (six) hours as needed for headache.    Historical Provider, MD  cephALEXin (KEFLEX) 500 MG capsule Take 1 capsule (500 mg total) by mouth 4 (four) times daily. 08/31/15   April Palumbo, MD  doxycycline (VIBRAMYCIN) 100 MG capsule Take 1 capsule (100 mg total) by mouth 2 (two) times daily. One po bid x 7 days 08/31/15   April Palumbo, MD  HYDROcodone-acetaminophen (NORCO/VICODIN) 5-325 MG  tablet Take 1-2 tablets by mouth every 6 (six) hours as needed. 03/09/15   Blanchie Dessert, MD  ibuprofen (ADVIL,MOTRIN) 200 MG tablet Take 200 mg by mouth every 6 (six) hours as needed for headache.    Historical Provider, MD  meloxicam (MOBIC) 7.5 MG tablet Take 1 tablet (7.5 mg total) by mouth daily. 08/31/15   April Palumbo, MD  traMADol (ULTRAM) 50 MG tablet Take 1 tablet (50 mg total) by mouth every 6 (six) hours as needed. 08/31/15   April Palumbo, MD  trimethoprim-polymyxin b (POLYTRIM) ophthalmic solution Place 1 drop into the left eye every 4 (four) hours. 04/28/15   [place], PA-C   Family History History reviewed. No pertinent family history.  Social History Social History  Substance Use Topics  . Smoking status: Never Smoker  . Smokeless tobacco: Never Used  . Alcohol use Yes   Allergies   Patient has no known allergies.  Review of Systems Review of Systems  Constitutional: Negative for fever.  Gastrointestinal: Negative for abdominal pain, nausea and vomiting.  Genitourinary: Negative for discharge and genital sores.  Skin: Positive for rash (Resolved).   Physical Exam Updated Vital Signs BP (!) 153/92 (BP Location: Left Arm)   Pulse 78   Temp 98.4 F (36.9 C) (Oral)   Resp 18   Ht 6' 2"  (1.88 m)   Wt 115.7 kg   SpO2 100%   BMI 32.74 kg/m   Physical Exam  Constitutional: He  is oriented to person, place, and time. He appears well-developed and well-nourished. No distress.  HENT:  Head: Normocephalic and atraumatic.  Cardiovascular: Normal rate.   Pulmonary/Chest: Effort normal.  Abdominal: Soft. Bowel sounds are normal.  Genitourinary: Testes normal and penis normal. Right testis shows no mass, no swelling and no tenderness. Left testis shows no mass, no swelling and no tenderness. Circumcised. No penile erythema or penile tenderness. No discharge found.  Genitourinary Comments: Chaperone present for exam patient tolerated without difficulties. No  lesions noted to the shaft or testicles. No inguinal adenopathy.  Neurological: He is alert and oriented to person, place, and time.  Skin: Skin is warm and dry. Capillary refill takes less than 2 seconds.  Psychiatric: He has a normal mood and affect.  Nursing note and vitals reviewed.  ED Treatments / Results  DIAGNOSTIC STUDIES: Oxygen Saturation is 100% on RA, nl by my interpretation.  COORDINATION OF CARE: 1:14 PM Discussed treatment plan with pt at bedside and pt agreed to plan.  Labs (all labs ordered are listed, but only abnormal results are displayed) Labs Reviewed  HSV 2 ANTIBODY, IGG  HIV ANTIBODY (ROUTINE TESTING)  RPR  GC/CHLAMYDIA PROBE AMP ([place]) NOT AT [facility]   EKG  EKG Interpretation None      Radiology No results found.  Procedures Procedures (including critical care time)  Medications Ordered in ED Medications - No data to display  Initial Impression / Assessment and Plan / ED Course  I have reviewed the triage vital signs and the nursing notes.  Pertinent labs & imaging results that were available during my care of the patient were reviewed by me and considered in my medical decision making (see chart for details).     The patient resents to the ED with asymptomatic STD exposure. States that he was exposed to HSV with his male partner last night. Denies any active lesions. Does note a red rash that was on his penis last week after shaving that it self resolved that evening. Patient requesting blood test for STDs. Will not treat as patient has no symptoms. Blood test and cultures are pending. Patient encouraged follow-up with health department. He has no active lesions concerning for HSV. Encourage patient to return to the ED reveals any active painful lesions. Patient was understanding the plan of care and all questions were answered prior to discharge. Final Clinical Impressions(s) / ED Diagnoses   Final diagnoses:  STD exposure    New  Prescriptions New Prescriptions   No medications on file   I personally performed the services described in this documentation, which was scribed in my presence. The recorded information has been reviewed and is accurate.    Tom Devoid, PA-C 05/14/16 [place], MD 05/14/16 1918

## 2016-05-14 NOTE — ED Notes (Signed)
This RN spoke with the pt on the phone re: inability to complete Gonnorhea test on sample collected during the visit, the collection tube used cannot be processed in our lab using the collection probe used, [place], PA aware, pt verbalizes understanding, pt states, "I am okay with not being tested for that. I am pretty sure I don't have it, but want to be called about my other results." charge RN aware

## 2016-05-14 NOTE — ED Triage Notes (Signed)
Pt reports being exposed to std and requests std screening, denies any symptoms.

## 2016-05-14 NOTE — Discharge Instructions (Signed)
Your test are pending. We'll be notified of the results. Patient to follow-up with the health department for further testing and treatment if necessary. Make sure he practices safe sex. Make she perform all partners. He develop any painful lesions to your genitals please return to the ED.

## 2016-05-15 LAB — HSV 2 ANTIBODY, IGG: HSV 2 Glycoprotein G Ab, IgG: <0.91 index (ref 0.00–0.90)

## 2016-05-15 LAB — RPR: RPR: NONREACTIVE

## 2016-05-15 LAB — HIV ANTIBODY (ROUTINE TESTING W REFLEX): HIV SCREEN 4TH GENERATION: NONREACTIVE

## 2016-07-18 ENCOUNTER — Emergency Department (HOSPITAL_COMMUNITY)
Admission: EM | Admit: 2016-07-18 | Discharge: 2016-07-18 | Disposition: A | Discharge status: Home / Self Care | Payer: BLUE CROSS/BLUE SHIELD | Attending: Emergency Medicine | Admitting: Emergency Medicine

## 2016-07-18 ENCOUNTER — Encounter (HOSPITAL_COMMUNITY): Payer: Self-pay | Admitting: Emergency Medicine

## 2016-07-18 DIAGNOSIS — Z86718 Personal history of other venous thrombosis and embolism: Secondary | ICD-10-CM | POA: Diagnosis not present

## 2016-07-18 DIAGNOSIS — J0101 Acute recurrent maxillary sinusitis: Secondary | ICD-10-CM

## 2016-07-18 DIAGNOSIS — R51 Headache: Secondary | ICD-10-CM | POA: Diagnosis present

## 2016-07-18 DIAGNOSIS — K5090 Crohn's disease, unspecified, without complications: Secondary | ICD-10-CM | POA: Insufficient documentation

## 2016-07-18 MED ORDER — FLUTICASONE PROPIONATE 50 MCG/ACT NA SUSP: 2.0000 | Freq: Every day | NASAL | 0 refills | Status: DC

## 2016-07-18 MED ORDER — AMOXICILLIN 500 MG PO CAPS
1000.0000 mg | ORAL_CAPSULE | Freq: Once | ORAL | Status: AC
  Administered 2016-07-18: 1000 mg via ORAL
  Filled 2016-07-18: qty 2

## 2016-07-18 MED ORDER — HYDROCODONE-ACETAMINOPHEN 5-325 MG PO TABS
1.0000 | ORAL_TABLET | Freq: Once | ORAL | Status: AC
  Administered 2016-07-18: 1 via ORAL
  Filled 2016-07-18: qty 1

## 2016-07-18 MED ORDER — AMOXICILLIN 500 MG PO CAPS: 1000.0000 mg | ORAL_CAPSULE | Freq: Two times a day (BID) | ORAL | 0 refills | Status: DC

## 2016-07-18 NOTE — ED Triage Notes (Signed)
Pt to ED for possible sinus infection x 1 week - states he has had intermittent headaches/sinus pressure, runny nose and congestion, post nasal drip; hasn't tried anything OTC except for ibuprofen which has not helped. Pt reports low-grade fevers earlier in the week, but none today.

## 2016-07-18 NOTE — ED Provider Notes (Signed)
[facility]-EMERGENCY DEPT Provider Note   CSN: [id number] Arrival date & time: 07/18/16  0012     History   Chief Complaint Chief Complaint  Patient presents with  . Recurrent Sinusitis    HPI Tom Ferguson is a 26 y.o. male.  Patient presents for evaluation of facial pain, postnasal drip and headache. No fever. He has had sinus pressure in the past but not this intense. He reports pain in the left face and subsequent left upper dentition pain. No trouble swallowing. He has a mild cough that is non-productive. No nausea or vomiting. He has not taken anything for symptoms. No history of seasonal allergies or asthma.    The history is provided by the patient. No language interpreter was used.    Past Medical History:  Diagnosis Date  . Crohn's disease ([facility])   . DVT (deep venous thrombosis) ([facility])     There are no active problems to display for this patient.   Past Surgical History:  Procedure Laterality Date  . COLON SURGERY     colostomy  . COLOSTOMY         Home Medications    Prior to Admission medications   Medication Sig Start Date End Date Taking? Authorizing Provider  acetaminophen (TYLENOL) 325 MG tablet Take 650 mg by mouth every 6 (six) hours as needed for headache.    [provider]  amoxicillin (AMOXIL) 500 MG capsule Take 2 capsules (1,000 mg total) by mouth 2 (two) times daily. 07/18/16   Elpidio Anis, PA-C  cephALEXin (KEFLEX) 500 MG capsule Take 1 capsule (500 mg total) by mouth 4 (four) times daily. 08/31/15   Palumbo, April, MD  doxycycline (VIBRAMYCIN) 100 MG capsule Take 1 capsule (100 mg total) by mouth 2 (two) times daily. One po bid x 7 days 08/31/15   Palumbo, April, MD  fluticasone [facility] 50 MCG/ACT nasal spray Place 2 sprays into both nostrils daily. 07/18/16   Elpidio Anis, PA-C  HYDROcodone-acetaminophen (NORCO/VICODIN) 5-325 MG tablet Take 1-2 tablets by mouth every 6 (six) hours as needed. 03/09/15   Gwyneth Sprout, MD  ibuprofen  (ADVIL,MOTRIN) 200 MG tablet Take 200 mg by mouth every 6 (six) hours as needed for headache.    [provider]  meloxicam (MOBIC) 7.5 MG tablet Take 1 tablet (7.5 mg total) by mouth daily. 08/31/15   Palumbo, April, MD  traMADol (ULTRAM) 50 MG tablet Take 1 tablet (50 mg total) by mouth every 6 (six) hours as needed. 08/31/15   Palumbo, April, MD  trimethoprim-polymyxin b (POLYTRIM) ophthalmic solution Place 1 drop into the left eye every 4 (four) hours. 04/28/15   Ward, Chase Picket, PA-C    Family History No family history on file.  Social History Social History  Substance Use Topics  . Smoking status: Never Smoker  . Smokeless tobacco: Never Used  . Alcohol use Yes     Comment: occ     Allergies   Patient has no known allergies.   Review of Systems Review of Systems  Constitutional: Negative for appetite change, chills and fever.  HENT: Positive for dental problem and sinus pain. Negative for facial swelling, sore throat and trouble swallowing.   Respiratory: Positive for cough. Negative for shortness of breath.   Cardiovascular: Negative.  Negative for chest pain.  Gastrointestinal: Negative.  Negative for nausea and vomiting.  Musculoskeletal: Negative.  Negative for myalgias and neck pain.  Skin: Negative.   Neurological: Positive for headaches.     Physical Exam Updated  Vital Signs BP 117/64   Pulse 60   Temp 98.6 F (37 C) (Oral)   Resp 16   Ht 6\' 2"  (1.88 m)   Wt 111.1 kg (245 lb)   SpO2 100%   BMI 31.46 kg/m   Physical Exam  Constitutional: He is oriented to person, place, and time. He appears well-developed and well-nourished.  HENT:  Head: Normocephalic.  Left maxillary, frontal and ethmoid sinus tenderness. No facial swelling. Generally good dentition with abscess, gum swelling or visualized caries. Oropharynx is benign.  Neck: Normal range of motion. Neck supple.  Cardiovascular: Normal rate and regular rhythm.   Pulmonary/Chest: Effort  normal and breath sounds normal. He has no wheezes. He has no rales.  Abdominal: Soft. Bowel sounds are normal. There is no tenderness. There is no rebound and no guarding.  Musculoskeletal: Normal range of motion.  Neurological: He is alert and oriented to person, place, and time.  Skin: Skin is warm and dry.  Psychiatric: He has a normal mood and affect.     ED Treatments / Results  Labs (all labs ordered are listed, but only abnormal results are displayed) Labs Reviewed - No data to display  EKG  EKG Interpretation None       Radiology No results found.  Procedures Procedures (including critical care time)  Medications Ordered in ED Medications  amoxicillin (AMOXIL) capsule 1,000 mg (1,000 mg Oral Given 07/18/16 0458)  HYDROcodone-acetaminophen (NORCO/VICODIN) 5-325 MG per tablet 1 tablet (1 tablet Oral Given 07/18/16 0458)     Initial Impression / Assessment and Plan / ED Course  I have reviewed the triage vital signs and the nursing notes.  Pertinent labs & imaging results that were available during my care of the patient were reviewed by me and considered in my medical decision making (see chart for details).     Patient presents with left facial pain, PND. Exam is consistent with sinusitis. Discussed OTC symptomatic treatment. Will cover with Amoxil Encouraged PCP follow up prn.  Final Clinical Impressions(s) / ED Diagnoses   Final diagnoses:  Acute recurrent maxillary sinusitis    New Prescriptions Discharge Medication List as of 07/18/2016  4:42 AM    START taking these medications   Details  amoxicillin (AMOXIL) 500 MG capsule Take 2 capsules (1,000 mg total) by mouth 2 (two) times daily., Starting Fri 07/18/2016, Print    fluticasone (FLONASE) 50 MCG/ACT nasal spray Place 2 sprays into both nostrils daily., Starting Fri 07/18/2016, Print         Elpidio Anis, PA-C 07/18/16 8638    Devoria Albe, MD 07/18/16 [phone]

## 2016-09-26 ENCOUNTER — Emergency Department (HOSPITAL_COMMUNITY)
Admission: EM | Admit: 2016-09-26 | Discharge: 2016-09-26 | Disposition: A | Discharge status: Home / Self Care | Payer: Self-pay | Attending: Emergency Medicine | Admitting: Emergency Medicine

## 2016-09-26 ENCOUNTER — Emergency Department (HOSPITAL_BASED_OUTPATIENT_CLINIC_OR_DEPARTMENT_OTHER): Admit: 2016-09-26 | Discharge: 2016-09-26 | Disposition: A | Discharge status: Home / Self Care | Payer: Self-pay

## 2016-09-26 ENCOUNTER — Encounter (HOSPITAL_COMMUNITY): Payer: Self-pay | Admitting: *Deleted

## 2016-09-26 DIAGNOSIS — M25561 Pain in right knee: Secondary | ICD-10-CM | POA: Insufficient documentation

## 2016-09-26 DIAGNOSIS — K50113 Crohn's disease of large intestine with fistula: Secondary | ICD-10-CM | POA: Insufficient documentation

## 2016-09-26 DIAGNOSIS — R197 Diarrhea, unspecified: Secondary | ICD-10-CM | POA: Insufficient documentation

## 2016-09-26 DIAGNOSIS — R195 Other fecal abnormalities: Secondary | ICD-10-CM | POA: Insufficient documentation

## 2016-09-26 DIAGNOSIS — Z79899 Other long term (current) drug therapy: Secondary | ICD-10-CM | POA: Insufficient documentation

## 2016-09-26 DIAGNOSIS — M79609 Pain in unspecified limb: Secondary | ICD-10-CM

## 2016-09-26 DIAGNOSIS — Z791 Long term (current) use of non-steroidal anti-inflammatories (NSAID): Secondary | ICD-10-CM | POA: Insufficient documentation

## 2016-09-26 DIAGNOSIS — Z933 Colostomy status: Secondary | ICD-10-CM | POA: Insufficient documentation

## 2016-09-26 DIAGNOSIS — M25461 Effusion, right knee: Secondary | ICD-10-CM | POA: Insufficient documentation

## 2016-09-26 LAB — CBC
HEMATOCRIT: 36.6 % — AB (ref 39.0–52.0)
HEMOGLOBIN: 10.9 g/dL — AB (ref 13.0–17.0)
MCH: 20 pg — AB (ref 26.0–34.0)
MCHC: 29.8 g/dL — ABNORMAL LOW (ref 30.0–36.0)
MCV: 67.2 fL — AB (ref 78.0–100.0)
Platelets: 424 10*3/uL — ABNORMAL HIGH (ref 150–400)
RBC: 5.45 MIL/uL (ref 4.22–5.81)
RDW: 18.7 % — ABNORMAL HIGH (ref 11.5–15.5)
WBC: 9.4 10*3/uL (ref 4.0–10.5)

## 2016-09-26 LAB — URINALYSIS, ROUTINE W REFLEX MICROSCOPIC
Bacteria, UA: NONE SEEN
Bilirubin Urine: NEGATIVE
Glucose, UA: NEGATIVE mg/dL
Hgb urine dipstick: NEGATIVE
KETONES UR: NEGATIVE mg/dL
Nitrite: NEGATIVE
PROTEIN: NEGATIVE mg/dL
Specific Gravity, Urine: 1.014 (ref 1.005–1.030)
pH: 5 (ref 5.0–8.0)

## 2016-09-26 LAB — COMPREHENSIVE METABOLIC PANEL
ALT: 29 U/L (ref 17–63)
ANION GAP: 7 (ref 5–15)
AST: 24 U/L (ref 15–41)
Albumin: 2.8 g/dL — ABNORMAL LOW (ref 3.5–5.0)
Alkaline Phosphatase: 80 U/L (ref 38–126)
BUN: 5 mg/dL — ABNORMAL LOW (ref 6–20)
CHLORIDE: 102 mmol/L (ref 101–111)
CO2: 26 mmol/L (ref 22–32)
Calcium: 9.1 mg/dL (ref 8.9–10.3)
Creatinine, Ser: 1.09 mg/dL (ref 0.61–1.24)
GFR calc non Af Amer: >60 mL/min (ref >60)
Glucose, Bld: 88 mg/dL (ref 65–99)
Potassium: 4 mmol/L (ref 3.5–5.1)
SODIUM: 135 mmol/L (ref 135–145)
Total Bilirubin: 0.3 mg/dL (ref 0.3–1.2)
Total Protein: 8.5 g/dL — ABNORMAL HIGH (ref 6.5–8.1)

## 2016-09-26 LAB — LIPASE, BLOOD: LIPASE: 31 U/L (ref 11–51)

## 2016-09-26 LAB — POC OCCULT BLOOD, ED: Fecal Occult Bld: NEGATIVE

## 2016-09-26 MED ORDER — SODIUM CHLORIDE 0.9 % IV BOLUS (SEPSIS)
1000.0000 mL | Freq: Once | INTRAVENOUS | Status: AC
  Administered 2016-09-26: 1000 mL via INTRAVENOUS

## 2016-09-26 MED ORDER — LOPERAMIDE HCL 2 MG PO CAPS: 2.0000 mg | ORAL_CAPSULE | Freq: Four times a day (QID) | ORAL | 0 refills | Status: DC | PRN

## 2016-09-26 MED ORDER — ONDANSETRON HCL 4 MG PO TABS: 4.0000 mg | ORAL_TABLET | Freq: Four times a day (QID) | ORAL | 0 refills | Status: DC

## 2016-09-26 MED ORDER — PREDNISONE 10 MG (21) PO TBPK: ORAL_TABLET | Freq: Every day | ORAL | 0 refills | Status: DC

## 2016-09-26 MED ORDER — PREDNISONE 20 MG PO TABS
60.0000 mg | ORAL_TABLET | Freq: Once | ORAL | Status: AC
  Administered 2016-09-26: 60 mg via ORAL
  Filled 2016-09-26: qty 3

## 2016-09-26 NOTE — Discharge Planning (Signed)
EDCM consulted to assist with PCP establishment for uninsured pt.  Due to inclement weather, Indigent Clinics are closed today.  EDCM advise pt to call Monday to get appointment.

## 2016-09-26 NOTE — ED Notes (Signed)
IV team at bedside 

## 2016-09-26 NOTE — ED Triage Notes (Signed)
Pt reports having crohns disease with colostomy. Having increase in abd cramping, pain and watery output for several weeks. Has bloody rectal discharge and also reports intermittent leg swelling and right knee pain.

## 2016-09-26 NOTE — Progress Notes (Signed)
Preliminary Results  There is no evidence of DVT in the right Lower Extremity. No evidence of Bakers Cyst. RT CFV appears patent.  Incidental finding: Enlarged Vascularized Lymph node noted in RT Groin area.   Tana Felts RVT RDCS

## 2016-09-26 NOTE — Discharge Instructions (Signed)
Medications: Prednisone, Imodium, Zofran  Treatment: Take prednisone as prescribed. Take Imodium up to 4 times daily as needed for loose stool and cramping. Take Zofran every 6 hours as needed for nausea or vomiting. Keep you leg elevated whenever you are not walking or standing on it. Stay off your leg whenever you can.  Follow-up: Please follow up with your GI doctor and rheumatologist as soon as possible. Please also see Dr. Ophelia Charter for further evaluation and treatment of your leg/knee pain as soon as you can as well. Please return to the ED if you develop any new or worsening symptoms including fever, abdominal distension, severe abdominal pain that is lasting, extensive bleeding, warmth or redness or severe pain with moving your knee, or any other new or concerning symptoms.

## 2016-09-26 NOTE — ED Provider Notes (Signed)
[facility] DEPT Provider Note   CSN: [id number] Arrival date & time: 09/26/16  4098     History   Chief Complaint Chief Complaint  Patient presents with  . Abdominal Pain  . Knee Pain    HPI Tom Ferguson is a 26 y.o. male with history of Crohn's disease, DVT who presents with worsening symptoms over the past several weeks including abdominal cramping, watery output to his colostomy, as well as intermittent bloody, rectal discharge. Patient also notes irritation and tenderness rectal fistula due to the discharge. Patient also reports acute on chronic intermittent right knee swelling, but worsening left calf pain with points of tenderness. Patient reports he was receiving Remicade treatments every 8 weeks, however he lost his insurance recently, and has not had treatment since around June. Patient presents today, because he has had to work and this is the first time he is able to come to the hospital. He reports she has intermittent pain episodes with cramping prior to bowel movement which lasts only a few seconds. He reports associated nausea with severe pain. He notes intermittent vomiting at baseline with certain things that he eats, but none recently. He has been urinating less, however he reports he is also drinking less. Patient reports known fistula that is intermittently open when his Crohn's is active.  HPI  Past Medical History:  Diagnosis Date  . Crohn's disease (HCC)   . DVT (deep venous thrombosis) (HCC)     There are no active problems to display for this patient.   Past Surgical History:  Procedure Laterality Date  . COLON SURGERY     colostomy  . COLOSTOMY         Home Medications    Prior to Admission medications   Medication Sig Start Date End Date Taking? Authorizing Provider  Boswellia Serrata (BOSWELLIA PO) Take 1 tablet by mouth 2 (two) times daily.   Yes [provider]  ergocalciferol (DRISDOL) 8000 UNIT/ML drops Take 8,000 Units by mouth  daily.   Yes [provider]  fluticasone (FLONASE) 50 MCG/ACT nasal spray Place 2 sprays into both nostrils daily. 07/18/16  Yes Upstill, Melvenia Beam, PA-C  InFLIXimab (REMICADE IV) Inject 1 each into the vein See admin instructions. Infusion every 8 weeks   Yes [provider]  Turmeric POWD 5 mLs by Does not apply route daily. Mix with 8oz of water   Yes [provider]  acetaminophen (TYLENOL) 325 MG tablet Take 650 mg by mouth every 6 (six) hours as needed for headache.    [provider]  amoxicillin (AMOXIL) 500 MG capsule Take 2 capsules (1,000 mg total) by mouth 2 (two) times daily. 07/18/16   Elpidio Anis, PA-C  cephALEXin (KEFLEX) 500 MG capsule Take 1 capsule (500 mg total) by mouth 4 (four) times daily. 08/31/15   Palumbo, April, MD  doxycycline (VIBRAMYCIN) 100 MG capsule Take 1 capsule (100 mg total) by mouth 2 (two) times daily. One po bid x 7 days 08/31/15   Palumbo, April, MD  HYDROcodone-acetaminophen (NORCO/VICODIN) 5-325 MG tablet Take 1-2 tablets by mouth every 6 (six) hours as needed. 03/09/15   Gwyneth Sprout, MD  ibuprofen (ADVIL,MOTRIN) 200 MG tablet Take 200 mg by mouth every 6 (six) hours as needed for headache.    [provider]  loperamide (IMODIUM) 2 MG capsule Take 1 capsule (2 mg total) by mouth 4 (four) times daily as needed for diarrhea or loose stools. 09/26/16   Renne Cornick, Waylan Boga, PA-C  meloxicam (MOBIC)  7.5 MG tablet Take 1 tablet (7.5 mg total) by mouth daily. 08/31/15   Palumbo, April, MD  ondansetron (ZOFRAN) 4 MG tablet Take 1 tablet (4 mg total) by mouth every 6 (six) hours. 09/26/16   Fate Caster, Waylan Boga, PA-C  predniSONE (STERAPRED UNI-PAK 21 TAB) 10 MG (21) TBPK tablet Take by mouth daily. Take 6 tabs by mouth daily  for 2 days, then 5 tabs for 2 days, then 4 tabs for 2 days, then 3 tabs for 2 days, 2 tabs for 2 days, then 1 tab by mouth daily for 2 days 09/26/16   Emi Holes, PA-C  traMADol (ULTRAM) 50 MG tablet Take 1  tablet (50 mg total) by mouth every 6 (six) hours as needed. 08/31/15   Palumbo, April, MD  trimethoprim-polymyxin b (POLYTRIM) ophthalmic solution Place 1 drop into the left eye every 4 (four) hours. 04/28/15   Ward, Chase Picket, PA-C    Family History History reviewed. No pertinent family history.  Social History Social History  Substance Use Topics  . Smoking status: Never Smoker  . Smokeless tobacco: Never Used  . Alcohol use Yes     Comment: occ     Allergies   Patient has no known allergies.   Review of Systems Review of Systems  Constitutional: Negative for chills and fever.  HENT: Negative for facial swelling and sore throat.   Respiratory: Negative for shortness of breath.   Cardiovascular: Negative for chest pain.  Gastrointestinal: Positive for abdominal pain (intermittent cramping), anal bleeding, blood in stool, diarrhea and nausea. Negative for vomiting.  Genitourinary: Positive for decreased urine volume. Negative for dysuria.  Musculoskeletal: Positive for arthralgias and joint swelling. Negative for back pain.  Skin: Negative for rash and wound.  Neurological: Negative for headaches.  Psychiatric/Behavioral: The patient is not nervous/anxious.      Physical Exam Updated Vital Signs BP 126/69   Pulse 74   Temp 97.9 F (36.6 C) (Oral)   Resp 16   SpO2 100%   Physical Exam  Constitutional: He appears well-developed and well-nourished. No distress.  HENT:  Head: Normocephalic and atraumatic.  Mouth/Throat: Oropharynx is clear and moist. No oropharyngeal exudate.  Eyes: Pupils are equal, round, and reactive to light. Conjunctivae are normal. Right eye exhibits no discharge. Left eye exhibits no discharge. No scleral icterus.  Neck: Normal range of motion. Neck supple. No thyromegaly present.  Cardiovascular: Normal rate, regular rhythm, normal heart sounds and intact distal pulses.  Exam reveals no gallop and no friction rub.   No murmur  heard. Pulmonary/Chest: Effort normal and breath sounds normal. No stridor. No respiratory distress. He has no wheezes. He has no rales.  Abdominal: Soft. Bowel sounds are normal. He exhibits no distension. There is no tenderness. There is no rebound and no guarding.    Genitourinary: Rectal exam shows fissure (+ fistula in cleft).  Musculoskeletal: He exhibits no edema.  Significant R knee edema, no tenderness or pain with passive ROM, no warmth or erythema; calf tenderness with quarter size areas of induration and darkening with point tenderness  Lymphadenopathy:    He has no cervical adenopathy.  Neurological: He is alert. Coordination normal.  Skin: Skin is warm and dry. No rash noted. He is not diaphoretic. No pallor.  Psychiatric: He has a normal mood and affect.  Nursing note and vitals reviewed.    ED Treatments / Results  Labs (all labs ordered are listed, but only abnormal results are displayed) Labs Reviewed  COMPREHENSIVE  METABOLIC PANEL - Abnormal; Notable for the following:       Result Value   BUN 5 (*)    Total Protein 8.5 (*)    Albumin 2.8 (*)    All other components within normal limits  CBC - Abnormal; Notable for the following:    Hemoglobin 10.9 (*)    HCT 36.6 (*)    MCV 67.2 (*)    MCH 20.0 (*)    MCHC 29.8 (*)    RDW 18.7 (*)    Platelets 424 (*)    All other components within normal limits  URINALYSIS, ROUTINE W REFLEX MICROSCOPIC - Abnormal; Notable for the following:    Leukocytes, UA TRACE (*)    Squamous Epithelial / LPF 0-5 (*)    All other components within normal limits  LIPASE, BLOOD  POC OCCULT BLOOD, ED    EKG  EKG Interpretation None       Radiology No results found.  Procedures Procedures (including critical care time)  Medications Ordered in ED Medications  sodium chloride 0.9 % bolus 1,000 mL (0 mLs Intravenous Stopped 09/26/16 1504)  predniSONE (DELTASONE) tablet 60 mg (60 mg Oral Given 09/26/16 1334)     Initial  Impression / Assessment and Plan / ED Course  I have reviewed the triage vital signs and the nursing notes.  Pertinent labs & imaging results that were available during my care of the patient were reviewed by me and considered in my medical decision making (see chart for details).     Patient with probable Crohn's flare due to not having Remicade injections. CBC shows hemoglobin 10.9, which is improved from patient. CMP within normal limits for patient. UA shows trace leukocytes only. Fecal occult negative. No abdominal tenderness indicating CT at this point. Ostomy well-appearing, no signs of infection. DVT study negative for DVT and right leg. Considering acute on chronic pain, doubt septic joint. No pain with passive range of motion. Afebrile. No referred to orthopedics. Patient talked with social work who will arrange for a follow-up to GI and rheumatology for patient to resume getting care in Remicade injections. Until that time, discharged home with Sterapred taper. Also discharged home with Zofran, Imodium. Return precautions discussed. Patient understands and agrees with plan. Patient vitals stable throughout ED course and discharged in satisfactory condition. I discussed patient case with Dr. Fredderick Phenix who guided the patient's management and agrees with plan.   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 Take  6 tabs by mouth daily  for 2 days, then 5 tabs for 2 days, then 4 tabs for 2 days, then 3 tabs for 2 days, 2 tabs for 2 days, then 1 tab by mouth daily for 2 days, Starting Fri 09/26/2016, Print         Raif Chachere, [place], PA-C 09/26/16 1801    Belfi,  Shawna Orleans, MD 09/27/16 [phone]

## 2016-09-30 ENCOUNTER — Ambulatory Visit (INDEPENDENT_AMBULATORY_CARE_PROVIDER_SITE_OTHER): Payer: Self-pay | Admitting: Family Medicine

## 2016-09-30 ENCOUNTER — Encounter: Payer: Self-pay | Admitting: Family Medicine

## 2016-09-30 VITALS — BP 107/66 | HR 74 | Temp 98.0°F | Resp 14 | Ht 74.0 in | Wt 225.0 lb

## 2016-09-30 DIAGNOSIS — M25562 Pain in left knee: Secondary | ICD-10-CM

## 2016-09-30 DIAGNOSIS — M25462 Effusion, left knee: Secondary | ICD-10-CM

## 2016-09-30 DIAGNOSIS — K5090 Crohn's disease, unspecified, without complications: Secondary | ICD-10-CM | POA: Insufficient documentation

## 2016-09-30 DIAGNOSIS — K50913 Crohn's disease, unspecified, with fistula: Secondary | ICD-10-CM

## 2016-09-30 LAB — IRON,TIBC AND FERRITIN PANEL
%SAT: 11 % (calc) — ABNORMAL LOW (ref 15–60)
Ferritin: 53 ng/mL (ref 20–345)
Iron: 23 ug/dL — ABNORMAL LOW (ref 50–195)
TIBC: 216 ug/dL — AB (ref 250–425)

## 2016-09-30 MED ORDER — DEXAMETHASONE SODIUM PHOSPHATE 10 MG/ML IJ SOLN
20.0000 mg | Freq: Once | INTRAMUSCULAR | Status: AC
  Administered 2016-09-30: 20 mg via INTRAMUSCULAR

## 2016-09-30 MED ORDER — PREDNISONE 20 MG PO TABS
ORAL_TABLET | ORAL | 0 refills | Status: DC
Start: 2016-09-30 — End: 2017-05-05

## 2016-09-30 MED FILL — ?PREDNISONE 20 MG TABLET: 20 | 12 days supply | Qty: 21 | Fill #0

## 2016-09-30 NOTE — Progress Notes (Signed)
Patient ID: Tom Ferguson, male    DOB: Jun 21, 1990, 26 y.o.   MRN: [id number]  PCP: Bing Neighbors, FNP  Chief Complaint  Patient presents with  . Establish Care  . Hospitalization Follow-up    Subjective:  HPI Tom Ferguson is a 26 y.o. male presents to establish care and hospital follow-up. Tom Ferguson was diagnosed with Crohn's disease at age 65 and underwent colectomy in 2008 while living in [place].  He is currently a Consulting civil engineer at [employer], although he is without insurance as he has taken this semester off. He was previously followed by Dr.Schooler at [facility] and Dr. Mallie Mussel at [facility]. Tom Ferguson's most recent treatment for Crohn's Disease consisted of Remicade infusions, although he has not receive recent infusion as he is uninsured and therapy was discontinued. He has a history of anemia, secondary to Crohn's disease and his baseline hemoglobin averages around 7-9. On 09/26/2016, Tom Ferguson presented to the ED for evaluation of abdominal pain, bloody stool, rectal discharge, joint pain, swelling of the right knee. DVT was ruled out via doppler studies. On exam finding consistent with a rectal fistula were noted. He was discharged from the ED on oral prednisone, advised to follow-up here today to obtain a PCP, follow-up with orthopedic surgery, and gastroenterology.  Due to financial barriers, Tom Ferguson has not picked up his prescription. Continues to experience knee pain and swelling. Tom Ferguson reports associated joint pain and swelling with Crohns exacerbations. Reports mucous like rectal drainage and watery like drainage from his stoma site.  No recent bloody dischagre noted. He reports self management of colostomy and obtains supplies through [employer]. Tolerating foods ok. Attempted to eat high protein low fat diet. Denies symptoms of nausea, vomiting, or foul smelling stool.  Social History   Social History  . Marital status: Single    Spouse name: N/A  . Number of  children: N/A  . Years of education: N/A   Occupational History  . Not on file.   Social History Main Topics  . Smoking status: Never Smoker  . Smokeless tobacco: Never Used  . Alcohol use Yes     Comment: occ  . Drug use: No  . Sexual activity: Not on file   Other Topics Concern  . Not on file   Social History Narrative  . No narrative on file    Family History  Problem Relation Age of Onset  . Hypertension Mother   . Cancer Father    Review of Systems  See HPI  No Known Allergies  Prior to Admission medications   Medication Sig Start Date End Date Taking? Authorizing Provider  acetaminophen (TYLENOL) 325 MG tablet Take 650 mg by mouth every 6 (six) hours as needed for headache.    [provider]  Jeanie Cooks Serrata (BOSWELLIA PO) Take 1 tablet by mouth 2 (two) times daily.    [provider]  ergocalciferol (DRISDOL) 8000 UNIT/ML drops Take 8,000 Units by mouth daily.    [provider]  fluticasone (FLONASE) 50 MCG/ACT nasal spray Place 2 sprays into both nostrils daily. Patient not taking: Reported on 09/30/2016 07/18/16   Elpidio Anis, PA-C  HYDROcodone-acetaminophen (NORCO/VICODIN) 5-325 MG tablet Take 1-2 tablets by mouth every 6 (six) hours as needed. Patient not taking: Reported on 09/30/2016 03/09/15   Gwyneth Sprout, MD  ibuprofen (ADVIL,MOTRIN) 200 MG tablet Take 200 mg by mouth every 6 (six) hours as needed for headache.    [provider]  InFLIXimab (REMICADE IV) Inject 1  each into the vein See admin instructions. Infusion every 8 weeks    [provider]  loperamide (IMODIUM) 2 MG capsule Take 1 capsule (2 mg total) by mouth 4 (four) times daily as needed for diarrhea or loose stools. Patient not taking: Reported on 09/30/2016 09/26/16   Emi Holes, PA-C  meloxicam (MOBIC) 7.5 MG tablet Take 1 tablet (7.5 mg total) by mouth daily. Patient not taking: Reported on 09/30/2016 08/31/15   Palumbo, April, MD   ondansetron (ZOFRAN) 4 MG tablet Take 1 tablet (4 mg total) by mouth every 6 (six) hours. Patient not taking: Reported on 09/30/2016 09/26/16   Emi Holes, PA-C  traMADol (ULTRAM) 50 MG tablet Take 1 tablet (50 mg total) by mouth every 6 (six) hours as needed. Patient not taking: Reported on 09/30/2016 08/31/15   Palumbo, April, MD  trimethoprim-polymyxin b (POLYTRIM) ophthalmic solution Place 1 drop into the left eye every 4 (four) hours. Patient not taking: Reported on 09/30/2016 04/28/15   Ward, Chase Picket, PA-C  Turmeric POWD 5 mLs by Does not apply route daily. Mix with 8oz of water    [provider]    Past Medical, Surgical Family and Social History reviewed and updated.    Objective:   Today's Vitals   09/30/16 0812  BP: 107/66  Pulse: 74  Resp: 14  Temp: 98 F (36.7 C)  TempSrc: Oral  SpO2: 96%  Weight: 225 lb (102.1 kg)  Height:  (1.88 m)    Wt Readings from Last 3 Encounters:  09/30/16 225 lb (102.1 kg)  07/18/16 245 lb (111.1 kg)  05/14/16 255 lb (115.7 kg)   Physical Exam  Constitutional: He is oriented to person, place, and time. He appears well-developed and well-nourished.  HENT:  Head: Normocephalic and atraumatic.  Right Ear: External ear normal.  Left Ear: External ear normal.  Eyes: Pupils are equal, round, and reactive to light. Conjunctivae and EOM are normal.  Neck: Normal range of motion. Neck supple. No thyromegaly present.  Cardiovascular: Normal rate, regular rhythm, normal heart sounds and intact distal pulses.   Pulmonary/Chest: Effort normal and breath sounds normal.  Abdominal: He exhibits no distension and no mass. There is no tenderness. There is no rebound and no guarding.  Stoma site-pink and protruding. No evidence of infection surrounding the stoma site.  Musculoskeletal:       Right knee: He exhibits decreased range of motion and swelling. Tenderness found. Patellar tendon tenderness noted.  Lymphadenopathy:     He has no cervical adenopathy.  Neurological: He is alert and oriented to person, place, and time.  Skin: Skin is warm and dry.  Psychiatric: He has a normal mood and affect. His behavior is normal. Judgment and thought content normal.   Assessment & Plan:  1. Crohn's disease with fistula, unspecified gastrointestinal tract location [facility]), chronic problem, previously treated with Remicade, discontinued due to lack of insurance coverage.  Will refer patient to [facility] as he is applying for [facility] Patient Assistance, for further evaluation. For now , will treat with oral prednisone taper to resolve current flare.  2. Pain and swelling of left knee,  - dexamethasone (DECADRON) injection 20 mg; Inject 2 mLs (20 mg total) into the muscle once. -Referral to orthopedic surgery  -Patient recently lost insurance and has been provided the [facility] Patient Assistance Application.   RTC: 1 week to evaluate knee swelling and pain  post steroid treatment.  Godfrey Pick. Tiburcio Pea, MSN, FNP-C  [facility]  [address] [place], [place] [address] [phone]

## 2016-09-30 NOTE — Patient Instructions (Signed)
Crohn Disease Crohn disease is a long-lasting (chronic) disease that affects your gastrointestinal (GI) tract. It often causes irritation and swelling (inflammation) in your small intestine and the beginning of your large intestine. However, it can affect any part of your GI tract. Crohn disease is part of a group of illnesses that are known as inflammatory bowel disease (IBD). Crohn disease may start slowly and get worse over time. Symptoms may come and go. They may also disappear for months or even years at a time (remission). What are the causes? The exact cause of Crohn disease is not known. It may be a response that causes your body's defense system (immune system) to mistakenly attack healthy cells and tissues (autoimmune response). Your genes and your environment may also play a role. What increases the risk? You may be at greater risk for Crohn disease if you:  Have other family members with Crohn disease or another IBD.  Use any tobacco products, including cigarettes, chewing tobacco, or electronic cigarettes.  Are in your 20s.  Have Eastern European ancestry.  What are the signs or symptoms? The main signs and symptoms of Crohn disease involve your GI tract. These include:  Diarrhea.  Rectal bleeding.  An urgent need to move your bowels.  The feeling that you are not finished having a bowel movement.  Abdominal pain or cramping.  Constipation.  General signs and symptoms of Crohn disease may also include:  Unexplained weight loss.  Fatigue.  Fever.  Nausea.  Loss of appetite.  Joint pain  Changes in vision.  Red bumps on your skin.  How is this diagnosed? Your health care provider may suspect Crohn disease based on your symptoms and your medical history. Your health care provider will do a physical exam. You may need to see a health care provider who specializes in diseases of the digestive tract (gastroenterologist). You may also have tests to help your  health care providers make a diagnosis. These may include:  Blood tests.  Stool sample tests.  Imaging tests, such as X-rays and CT scans.  Tests to examine the inside of your intestines using a long, flexible tube that has a light and a camera on the end (endoscopy or colonoscopy).  A procedure to take tissue samples from inside your bowel (biopsy) to be examined under a microscope.  How is this treated? There is no cure for Crohn disease. Treatment will focus on managing your symptoms. Crohn disease affects each person differently. Your treatment may include:  Resting your bowels. Drinking only clear liquids or getting nutrition through an IV for a period of time gives your bowels a chance to heal because they are not passing stools.  Medicines. These may be used alone or in combination (combination therapy). These may include antibiotic medicines. You may be given medicines that help to: ? Reduce inflammation. ? Control your immune system activity. ? Fight infections. ? Relieve cramps and prevent diarrhea. ? Control your pain.  Surgery. You may need surgery if: ? Medicines and other treatments are no longer working. ? You develop complications from severe Crohn disease. ? A section of your intestine becomes so damaged that it needs to be removed.  Follow these instructions at home:  Take medicines only as directed by your health care provider.  If you were prescribed an antibiotic medicine, finish it all even if you start to feel better.  Keep all follow-up visits as directed by your health care provider. This is important.  Talk with your   health care provider about changing your diet. This may help your symptoms. Your health care provide may recommend changes, such as: ? Drinking more fluids. ? Avoiding milk and other foods that contain lactose. ? Eating a low-fat diet. ? Avoiding high-fiber foods, such as popcorn and nuts. ? Avoiding carbonated beverages, such as  soda. ? Eating smaller meals more often rather than eating large meals. ? Keeping a food diary to identify foods that make your symptoms better or worse.  Do not use any tobacco products, including cigarettes, chewing tobacco, or electronic cigarettes. If you need help quitting, ask your health care provider.  Limit alcohol intake to no more than 1 drink per day for nonpregnant women and 2 drinks per day for men. One drink equals 12 ounces of beer, 5 ounces of wine, or 1 ounces of hard liquor.  Exercise daily or as directed by your health care provider. Contact a health care provider if:  You have diarrhea, abdominal cramps, and other gastrointestinal problems that are present almost all of the time.  Your symptoms do not improve with treatment.  You continue to lose weight.  You develop a rash or sores on your skin.  You develop eye problems.  You have a fever.  Your symptoms get worse.  You develop new symptoms. Get help right away if:  You have bloody diarrhea.  You develop severe abdominal pain.  You cannot pass stools. This information is not intended to replace advice given to you by your health care provider. Make sure you discuss any questions you have with your health care provider. Document Released: 10/09/2004 Document Revised: 05/10/2015 Document Reviewed: 08/17/2013 Elsevier Interactive Patient Education  2018 [employer]  

## 2016-10-02 ENCOUNTER — Telehealth: Payer: Self-pay

## 2016-10-02 NOTE — Telephone Encounter (Signed)
Please fax attached letter permitting return to work.

## 2016-10-05 ENCOUNTER — Telehealth: Payer: Self-pay | Admitting: Family Medicine

## 2016-10-05 ENCOUNTER — Other Ambulatory Visit: Payer: Self-pay | Admitting: Family Medicine

## 2016-10-05 NOTE — Telephone Encounter (Signed)
Contact patient to advise that his iron level is low and schedule him for a intravenous iron infusion with injectafer at the day hospital within 1 week. Advise patient that iron replacement will require two infusion separated by 7 days.

## 2016-10-06 NOTE — Telephone Encounter (Signed)
Left vm for patient to call back.

## 2016-10-07 ENCOUNTER — Ambulatory Visit (INDEPENDENT_AMBULATORY_CARE_PROVIDER_SITE_OTHER): Payer: Self-pay | Admitting: Family Medicine

## 2016-10-07 ENCOUNTER — Encounter: Payer: Self-pay | Admitting: Family Medicine

## 2016-10-07 VITALS — BP 114/60 | HR 60 | Temp 97.8°F | Resp 14 | Ht 74.0 in | Wt 231.0 lb

## 2016-10-07 DIAGNOSIS — M25561 Pain in right knee: Secondary | ICD-10-CM

## 2016-10-07 NOTE — Telephone Encounter (Signed)
Patient notified

## 2016-10-07 NOTE — Progress Notes (Signed)
Patient ID: Tom Ferguson, male    DOB: [birth year], 26 y.o.   MRN: [id number]  PCP: Bing Neighbors, FNP  Chief Complaint  Patient presents with  . Follow-up    1 WEEK    Subjective:  HPI Tom Ferguson is a 26 y.o. male , with a history of Crohn's disease with recent exacerbation, presents for 1 week follow-up knee pain and swelling. Tom Ferguson was seen in office on 09/30/2016 with excessive swelling and pain localized to right knee. He was treated with a decadron injection and placed an extended 9 days prednisone taper. Today, he reports improvement of pain and resolution of swelling. Tenderness at the patellar tendon continues to occur with prolonged walking or standing, however pain is tolerable. He has not completed the application for financial assistance as of yet in order to be referred to orthopedics. He intends to complete this week. Social History   Social History  . Marital status: Single    Spouse name: N/A  . Number of children: N/A  . Years of education: N/A   Occupational History  . Not on file.   Social History Main Topics  . Smoking status: Never Smoker  . Smokeless tobacco: Never Used  . Alcohol use Yes     Comment: occ  . Drug use: No  . Sexual activity: Not on file   Other Topics Concern  . Not on file   Social History Narrative  . No narrative on file    Family History  Problem Relation Age of Onset  . Hypertension Mother   . Cancer Father    Review of Systems See hpi Patient Active Problem List   Diagnosis Date Noted  . Crohn disease (HCC) 09/30/2016    No Known Allergies  Prior to Admission medications   Medication Sig Start Date End Date Taking? Authorizing Provider  predniSONE (DELTASONE) 20 MG tablet 3 tablets x 3 days, 2 tablets x3 days, 1 tablet X 3 days, and  tablet x 3 days. 09/30/16  Yes Bing Neighbors, FNP  acetaminophen (TYLENOL) 325 MG tablet Take 650 mg by mouth every 6 (six) hours as needed for headache.    [provider]  Jeanie Cooks Serrata (BOSWELLIA PO) Take 1 tablet by mouth 2 (two) times daily.    [provider]  ergocalciferol (DRISDOL) 8000 UNIT/ML drops Take 8,000 Units by mouth daily.    [provider]  fluticasone (FLONASE) 50 MCG/ACT nasal spray Place 2 sprays into both nostrils daily. Patient not taking: Reported on 10/07/2016 07/18/16   Elpidio Anis, PA-C  HYDROcodone-acetaminophen (NORCO/VICODIN) 5-325 MG tablet Take 1-2 tablets by mouth every 6 (six) hours as needed. Patient not taking: Reported on 10/07/2016 03/09/15   Gwyneth Sprout, MD  ibuprofen (ADVIL,MOTRIN) 200 MG tablet Take 200 mg by mouth every 6 (six) hours as needed for headache.    [provider]  InFLIXimab (REMICADE IV) Inject 1 each into the vein See admin instructions. Infusion every 8 weeks    [provider]  ondansetron (ZOFRAN) 4 MG tablet Take 1 tablet (4 mg total) by mouth every 6 (six) hours. Patient not taking: Reported on 10/07/2016 09/26/16   Emi Holes, PA-C  Turmeric POWD 5 mLs by Does not apply route daily. Mix with 8oz of water    [provider]    Past Medical, Surgical Family and Social History reviewed and updated.    Objective:   Today's Vitals   10/07/16 0837  BP: 114/60  Pulse: 60  Resp: 14  Temp: 97.8 F (36.6 C)  TempSrc: Oral  SpO2: 98%  Weight: 231 lb (104.8 kg)  Height: 6\' 2"  (1.88 m)    Wt Readings from Last 3 Encounters:  10/07/16 231 lb (104.8 kg)  09/30/16 225 lb (102.1 kg)  07/18/16 245 lb (111.1 kg)    Physical Exam  Cardiovascular: Normal rate, regular rhythm, normal heart sounds and intact distal pulses.   Pulmonary/Chest: Effort normal and breath sounds normal.  Musculoskeletal:       Right knee: He exhibits complete ROM and trace swelling of right knee. Tenderness found. Patellar tendon tenderness noted.  Lymphadenopathy:    He has no cervical adenopathy.  Neurological: He is alert and oriented to person,  place, and time.  Skin: Skin is warm and dry.      Assessment & Plan:  1. Acute pain of right knee, improved stable. Complete the remainder of prednisone taper. Complete financial assistance application.   RTC: 3 months chronic condition follow-up   Godfrey Pick. Tiburcio Pea, MSN, FNP-C [facility]  [address] [place], [place] [address] [phone]

## 2016-10-13 ENCOUNTER — Ambulatory Visit (HOSPITAL_COMMUNITY)
Admission: RE | Admit: 2016-10-13 | Discharge: 2016-10-13 | Disposition: A | Discharge status: Home / Self Care | Payer: Self-pay | Source: Ambulatory Visit | Attending: Family Medicine | Admitting: Family Medicine

## 2016-10-13 DIAGNOSIS — E611 Iron deficiency: Secondary | ICD-10-CM | POA: Insufficient documentation

## 2016-10-13 MED ORDER — SODIUM CHLORIDE 0.9 % IV SOLN
750.0000 mg | Freq: Once | INTRAVENOUS | Status: AC
  Administered 2016-10-13: 750 mg via INTRAVENOUS
  Filled 2016-10-13: qty 15

## 2016-10-13 NOTE — Progress Notes (Signed)
Provider: Kimberly Harris, NP  Diagnoses: Iron deficiency  Procedure:  Infusion of 750mg Injectafer over 20 minutes per protocol.  Patient tolerated well without complaints.  Discharge in stable condition.  Instructions to return in 1 week.  

## 2016-10-13 NOTE — Discharge Instructions (Signed)
Return on 7 days for another infusion and repeat TIBC and CBC   Iron Deficiency Anemia, Adult Iron-deficiency anemia is when you have a low amount of red blood cells or hemoglobin. This happens because you have too little iron in your body. Hemoglobin carries oxygen to parts of the body. Anemia can cause your body to not get enough oxygen. It may or may not cause symptoms. Follow these instructions at home: Medicines  Take over-the-counter and prescription medicines only as told by your doctor. This includes iron pills (supplements) and vitamins.  If you cannot handle taking iron pills by mouth, ask your doctor about getting iron through: ? A vein (intravenously). ? A shot (injection) into a muscle.  Take iron pills when your stomach is empty. If you cannot handle this, take them with food.  Do not drink milk or take antacids at the same time as your iron pills.  To prevent trouble pooping (constipation), eat fiber or take medicine (stool softener) as told by your doctor. Eating and drinking  Talk with your doctor before changing the foods you eat. He or she may tell you to eat foods that have a lot of iron, such as: ? Liver. ? Lowfat (lean) beef. ? Breads and cereals that have iron added to them (fortified breads and cereals). ? Eggs. ? Dried fruit. ? Dark green, leafy vegetables.  Drink enough fluid to keep your pee (urine) clear or pale yellow.  Eat fresh fruits and vegetables that are high in vitamin C. They help your body to use iron. Foods with a lot of vitamin C include: ? Oranges. ? Peppers. ? Tomatoes. ? Mangoes. General instructions  Return to your normal activities as told by your doctor. Ask your doctor what activities are safe for you.  Keep yourself clean, and keep things clean around you (your surroundings). Anemia can make you get sick more easily.  Keep all follow-up visits as told by your doctor. This is important. Contact a doctor if:  You feel sick to  your stomach (nauseous).  You throw up (vomit).  You feel weak.  You are sweating for no clear reason.  You have trouble pooping, such as: ? Pooping (having a bowel movement) less than 3 times a week. ? Straining to poop. ? Having poop that is hard, dry, or larger than normal. ? Feeling full or bloated. ? Pain in the lower belly. ? Not feeling better after pooping. Get help right away if:  You pass out (faint). If this happens, do not drive yourself to the hospital. Call your local emergency services (911 in the [place]  You have chest pain.  You have shortness of breath that: ? Is very bad. ? Gets worse with physical activity.  You have a fast heartbeat.  You get light-headed when getting up from sitting or lying down. This information is not intended to replace advice given to you by your health care provider. Make sure you discuss any questions you have with your health care provider. Document Released: 02/01/2010 Document Revised: 09/19/2015 Document Reviewed: 09/19/2015 Elsevier Interactive Patient Education  2017 [employer].

## 2016-10-20 ENCOUNTER — Ambulatory Visit (HOSPITAL_COMMUNITY): Admission: RE | Admit: 2016-10-20 | Payer: Self-pay | Source: Ambulatory Visit

## 2016-10-23 ENCOUNTER — Ambulatory Visit (HOSPITAL_COMMUNITY)
Admission: RE | Admit: 2016-10-23 | Discharge: 2016-10-23 | Disposition: A | Discharge status: Home / Self Care | Payer: Self-pay | Source: Ambulatory Visit | Attending: Family Medicine | Admitting: Family Medicine

## 2016-10-23 VITALS — BP 136/80 | HR 80 | Temp 98.2°F | Resp 18

## 2016-10-23 DIAGNOSIS — K5090 Crohn's disease, unspecified, without complications: Secondary | ICD-10-CM

## 2016-10-23 MED ORDER — SODIUM CHLORIDE 0.9 % IV SOLN
750.0000 mg | Freq: Once | INTRAVENOUS | Status: AC
  Administered 2016-10-23: 750 mg via INTRAVENOUS
  Filled 2016-10-23: qty 15

## 2016-10-23 NOTE — Progress Notes (Signed)
Provider: Joaquin Courts, NP  Diagnoses: Iron deficiency  Procedure:  Infusion of  Injectafer over 20 minutes per protocol.  Patient tolerated well without complaints.  Discharge in stable condition.  Instructions to return in 1 week.

## 2016-10-30 ENCOUNTER — Encounter (INDEPENDENT_AMBULATORY_CARE_PROVIDER_SITE_OTHER): Payer: Self-pay | Admitting: Orthopaedic Surgery

## 2016-10-30 ENCOUNTER — Ambulatory Visit (INDEPENDENT_AMBULATORY_CARE_PROVIDER_SITE_OTHER): Payer: Self-pay | Admitting: Orthopaedic Surgery

## 2016-10-30 ENCOUNTER — Ambulatory Visit (INDEPENDENT_AMBULATORY_CARE_PROVIDER_SITE_OTHER): Payer: Self-pay

## 2016-10-30 DIAGNOSIS — M25461 Effusion, right knee: Secondary | ICD-10-CM

## 2016-10-30 MED ORDER — METHYLPREDNISOLONE ACETATE 40 MG/ML IJ SUSP
40.0000 mg | INTRAMUSCULAR | Status: AC | PRN
  Administered 2016-10-30: 40 mg via INTRA_ARTICULAR

## 2016-10-30 MED ORDER — BUPIVACAINE HCL 0.5 % IJ SOLN
2.0000 mL | INTRAMUSCULAR | Status: AC | PRN
  Administered 2016-10-30: 2 mL via INTRA_ARTICULAR

## 2016-10-30 MED ORDER — LIDOCAINE HCL 1 % IJ SOLN
2.0000 mL | INTRAMUSCULAR | Status: AC | PRN
  Administered 2016-10-30: 2 mL

## 2016-10-30 NOTE — Progress Notes (Signed)
Office Visit Note   Patient: Tom Ferguson           Date of Birth: 28-Jan-1990           MRN: [id number] Visit Date: 10/30/2016              Requested by: Tom Ferguson, [place], [place] [address] PCP: Tom Jun, FNP   Assessment & Plan: Visit Diagnoses:  1. Effusion of right knee     Plan: Patient has a large knee effusion possibly related to his underlying Crohn's disease which is not well controlled at this point. I was able to aspirate approximately 65 mL from his right knee which was then sent off for analysis. We discussed the importance of controlling his Crohn's disease. Questions encouraged and answered. We will be in touch with the patient regarding the fluid results. Total face to face encounter time was greater than 45 minutes and over half of this time was spent in counseling and/or coordination of care.  Follow-Up Instructions: Return if symptoms worsen or fail to improve.   Orders:  Orders Placed This Encounter  Procedures  . XR KNEE 3 VIEW RIGHT  . Synovial cell count + diff, w/ crystals   No orders of the defined types were placed in this encounter.     Procedures: Large Joint Inj Date/Time: 10/30/2016 7:16 PM Performed by: Leandrew Koyanagi Authorized by: Leandrew Koyanagi   Consent Given by:  Patient Timeout: prior to procedure the correct patient, procedure, and site was verified   Indications:  Pain Location:  Knee Site:  R knee Prep: patient was prepped and draped in usual sterile fashion   Needle Size:  22 G Approach:  Lateral Ultrasound Guidance: No   Fluoroscopic Guidance: No   Arthrogram: No   Medications:  2 mL bupivacaine 0.5 %; 40 mg methylPREDNISolone acetate 40 MG/ML; 2 mL lidocaine 1 % Aspirate amount (mL):  70 Aspirate:  Blood-tinged Patient tolerance:  Patient tolerated the procedure well with no immediate complications     Clinical Data: No additional findings.   Subjective: Chief Complaint  Patient  presents with  . Right Knee - Pain, Edema    Patient is a 26 year old who comes in with one-month history of right knee swelling that has now gotten any better. He does have a history of Crohn's disease. He has been treating it with compression wraps and ice and elevation. He had a Doppler that was negative for DVT. He takes ibuprofen as needed. Denies any constitutional symptoms.  Denies any numbness and tingling.    Review of Systems  Constitutional: Negative.   All other systems reviewed and are negative.    Objective: Vital Signs: There were no vitals taken for this visit.  Physical Exam  Constitutional: He is oriented to person, place, and time. He appears well-developed and well-nourished.  HENT:  Head: Normocephalic and atraumatic.  Eyes: Pupils are equal, round, and reactive to light.  Neck: Neck supple.  Pulmonary/Chest: Effort normal.  Abdominal: Soft.  Musculoskeletal: Normal range of motion.  Neurological: He is alert and oriented to person, place, and time.  Skin: Skin is warm.  Psychiatric: He has a normal mood and affect. His behavior is normal. Judgment and thought content normal.  Nursing note and vitals reviewed.   Ortho Exam Right knee exam shows a large joint effusion. No signs of infection. Exam is otherwise grossly normal. Specialty Comments:  No specialty comments available.  Imaging:  Xr Knee 3 View Right  Result Date: 10/30/2016 No significant degenerative joint disease    PMFS History: Patient Active Problem List   Diagnosis Date Noted  . Effusion of right knee 10/30/2016  . Crohn disease ([facility]) 09/30/2016   Past Medical History:  Diagnosis Date  . Crohn's disease ([facility])   . DVT (deep venous thrombosis) (HCC)     Family History  Problem Relation Age of Onset  . Hypertension Mother   . Cancer Father     Past Surgical History:  Procedure Laterality Date  . COLON SURGERY     colostomy  . COLOSTOMY     Social History    Occupational History  . Not on file.   Social History Main Topics  . Smoking status: Never Smoker  . Smokeless tobacco: Never Used  . Alcohol use Yes     Comment: occ  . Drug use: No  . Sexual activity: Not on file

## 2016-10-31 LAB — TIQ-NTM

## 2016-10-31 NOTE — Progress Notes (Signed)
Can you please call lab and see if they can add on stat gram stain and cultures to the fluid we sent yesterday please.  Thanks

## 2016-11-05 LAB — SYNOVIAL CELL COUNT + DIFF, W/ CRYSTALS
Basophils, %: 0 %
Eosinophils-Synovial: 0 % (ref 0–2)
Lymphocytes-Synovial Fld: 1 % (ref 0–74)
MONOCYTE/MACROPHAGE: 8 % (ref 0–69)
Neutrophil, Synovial: 91 % — ABNORMAL HIGH (ref 0–24)
Synoviocytes, %: 0 % (ref 0–15)
WBC, SYNOVIAL: 61100 {cells}/uL — AB (ref <150)

## 2016-11-05 LAB — FUNGUS CULTURE W SMEAR

## 2016-11-05 LAB — TEST AUTHORIZATION

## 2016-11-13 ENCOUNTER — Ambulatory Visit (INDEPENDENT_AMBULATORY_CARE_PROVIDER_SITE_OTHER): Payer: Self-pay | Admitting: Orthopaedic Surgery

## 2016-11-13 DIAGNOSIS — M25461 Effusion, right knee: Secondary | ICD-10-CM

## 2016-11-13 NOTE — Progress Notes (Signed)
Office Visit Note   Patient: Tom Ferguson           Date of Birth: 1990-10-11           MRN: [id number] Visit Date: 11/13/2016              Requested by: Scot Jun, [place], [place] [address] PCP: Scot Jun, FNP   Assessment & Plan: Visit Diagnoses:  1. Effusion of right knee     Plan: Impression is probable septic arthritis right knee.  We discussed today the necessity for surgery and irrigation debridement to control the infection and its spread to the rest of his right lower extremity and potential for sepsis.  Patient is refusing surgery.  We discussed the risks of not undergoing surgery including arthritis, osteomyelitis, loss of limb or life.  I addressed these risks with him multiple times and he voiced understanding and still refused surgery.  I told him that he should reconsider.  I told him that he is always welcome to follow-up with [place] or with an ED should he change his mind.  Follow-up as needed. Total face to face encounter time was greater than 25 minutes and over half of this time was spent in counseling and/or coordination of care.  Follow-Up Instructions: Return if symptoms worsen or fail to improve.   Orders:  No orders of the defined types were placed in this encounter.  No orders of the defined types were placed in this encounter.     Procedures: No procedures performed   Clinical Data: No additional findings.   Subjective: No chief complaint on file.   Tom Ferguson is a 26 year old gentleman who is following up today for recurrent right knee effusion.  He states that the previous aspiration and injection helped for about a week and half.  He denies any constitutional symptoms.  He is currently under no treatments for his Crohn's disease.    Review of Systems  Constitutional: Negative.   All other systems reviewed and are negative.    Objective: Vital Signs: There were no vitals taken for this visit.  Physical Exam    Constitutional: He is oriented to person, place, and time. He appears well-developed and well-nourished.  Pulmonary/Chest: Effort normal.  Abdominal: Soft.  Neurological: He is alert and oriented to person, place, and time.  Skin: Skin is warm.  Psychiatric: He has a normal mood and affect. His behavior is normal. Judgment and thought content normal.  Nursing note and vitals reviewed.   Ortho Exam Right knee exam shows a large joint effusion.  No evidence of cellulitis.  Painful range of motion.  Slightly warm to touch. Specialty Comments:  No specialty comments available.  Imaging: No results found.   PMFS History: Patient Active Problem List   Diagnosis Date Noted  . Effusion of right knee 10/30/2016  . Crohn disease ([facility]) 09/30/2016   Past Medical History:  Diagnosis Date  . Crohn's disease ([facility])   . DVT (deep venous thrombosis) (HCC)     Family History  Problem Relation Age of Onset  . Hypertension Mother   . Cancer Father     Past Surgical History:  Procedure Laterality Date  . COLON SURGERY     colostomy  . COLOSTOMY     Social History   Occupational History  . Not on file.   Social History Main Topics  . Smoking status: Never Smoker  . Smokeless tobacco: Never Used  . Alcohol use  Yes     Comment: occ  . Drug use: No  . Sexual activity: Not on file

## 2016-12-01 ENCOUNTER — Encounter: Payer: Self-pay | Admitting: Family Medicine

## 2017-02-04 IMAGING — CT CT HEAD W/O CM
1 series · 16 of 30 positions shown, 20 images · non-contrast
Comparison: None.

CLINICAL DATA: Acute onset of right frontal headache. Photophobia
and blurred vision. Initial encounter.

EXAM:
CT HEAD WITHOUT CONTRAST
TECHNIQUE: Contiguous axial images were obtained from the base of the skull
through the vertex without intravenous contrast.

[Series 2: head 5.0 h30s · axial · 0.44mm/px · z∈[-137,+8]mm · 16 of 33 slices shown, 20 images]
[im 2/33  brain]
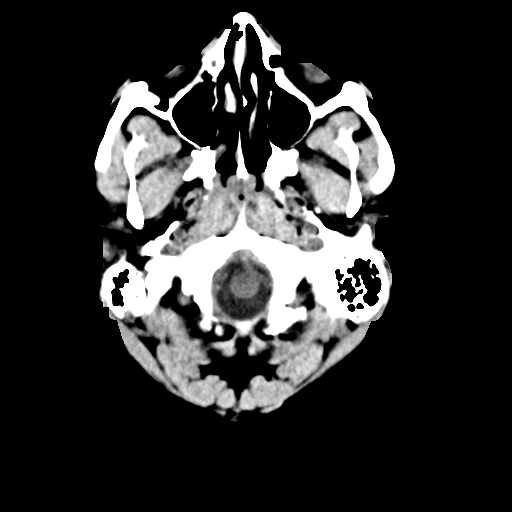
[im 2/33  bone]
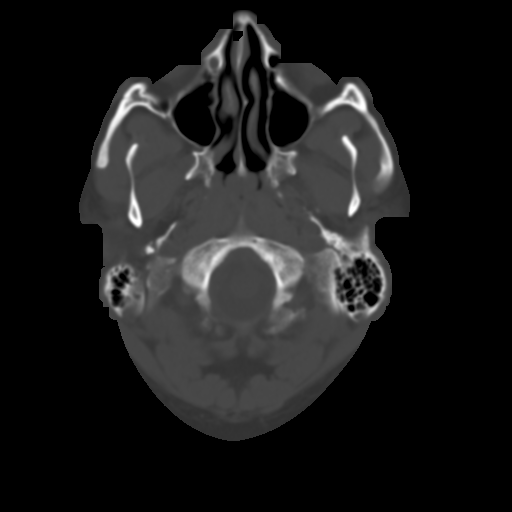
[im 4/33  brain]
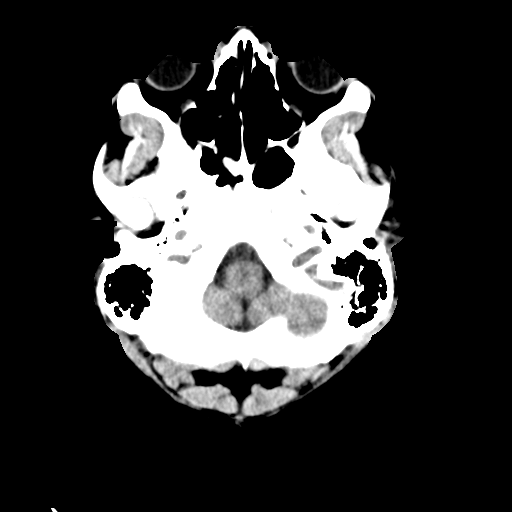
[im 6/33  brain]
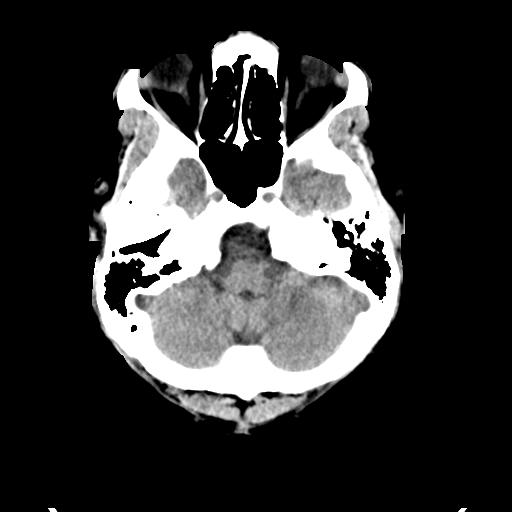
[im 8/33  brain]
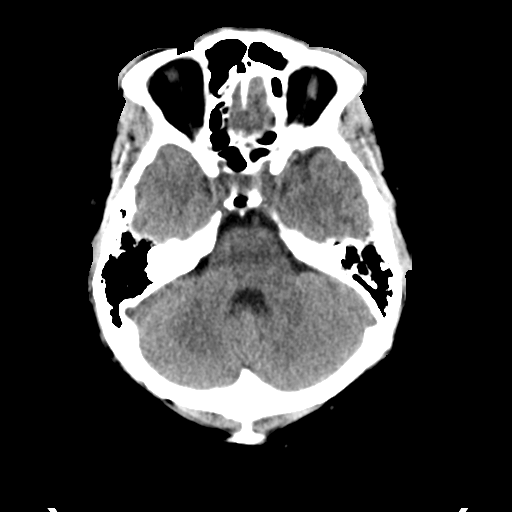
[im 9/33  brain]
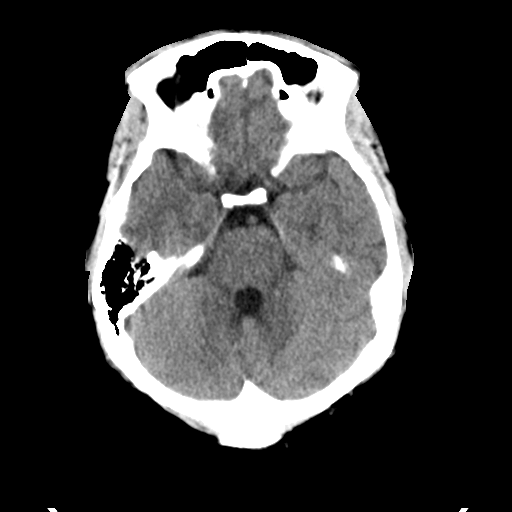
[im 9/33  bone]
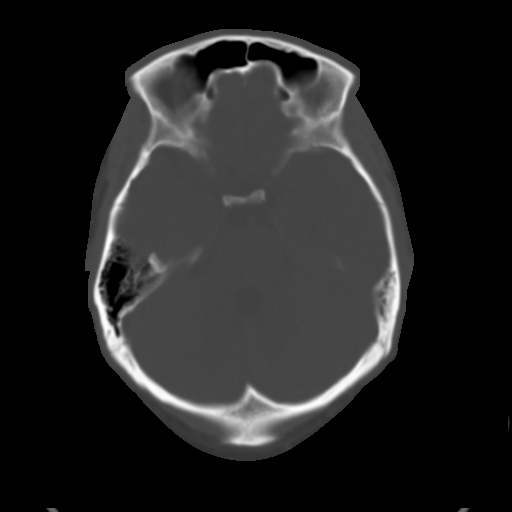
[im 12/33  brain]
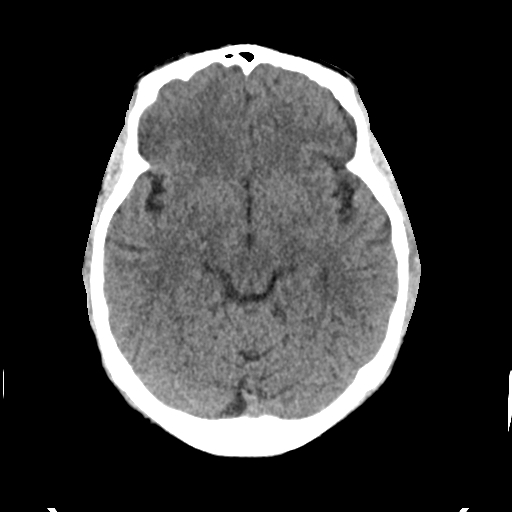
[im 14/33  brain]
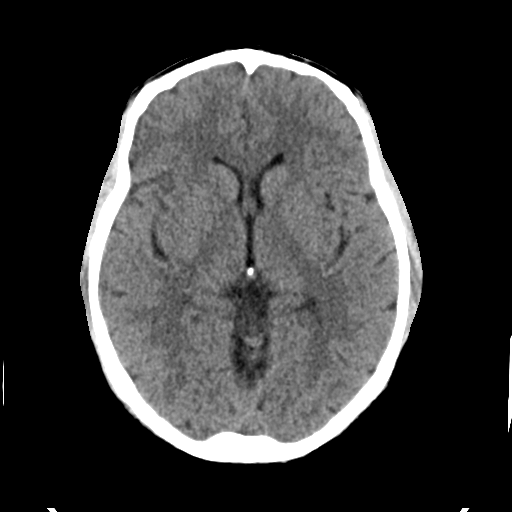
[im 16/33  brain]
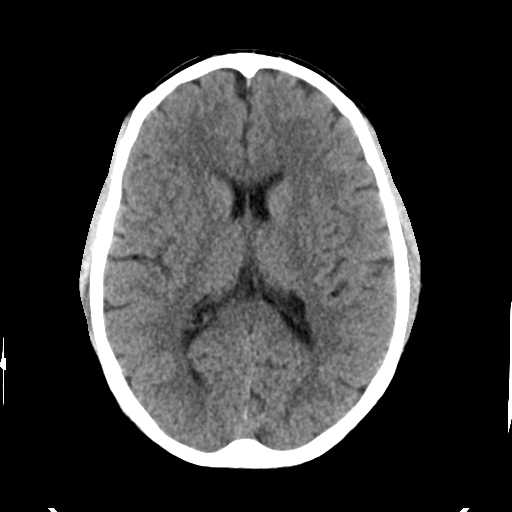
[im 17/33  brain]
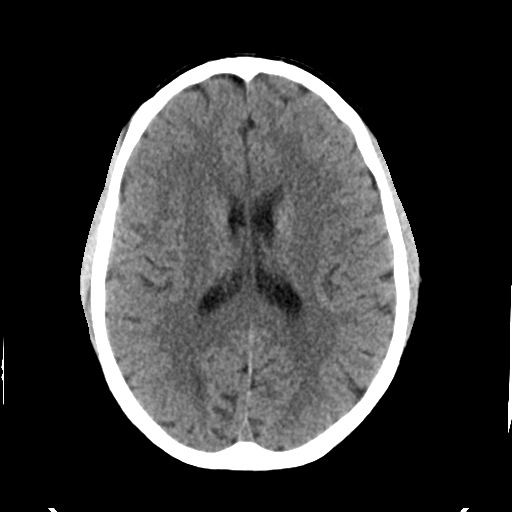
[im 17/33  bone]
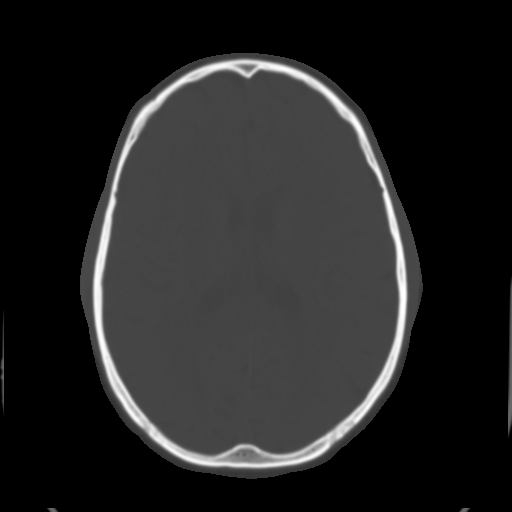
[im 19/33  brain]
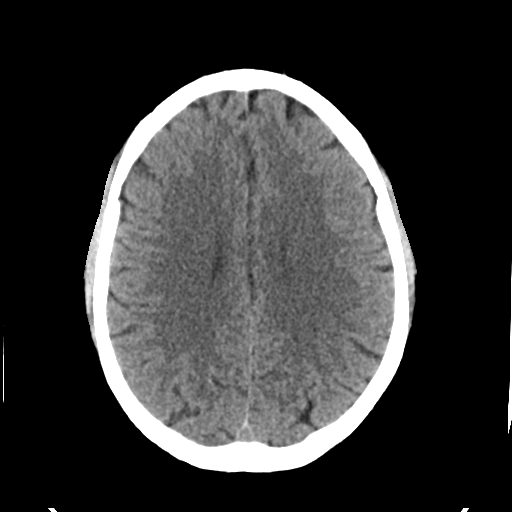
[im 21/33  brain]
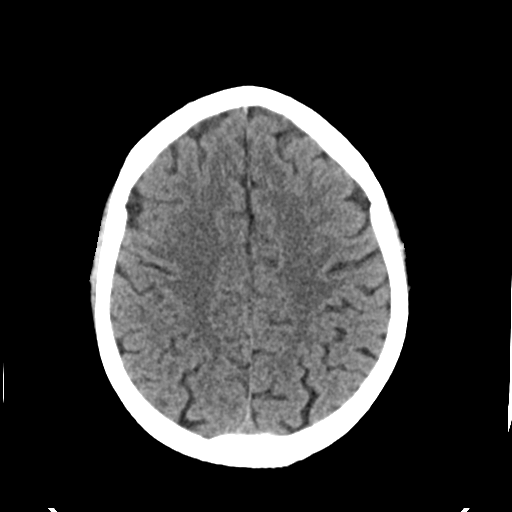
[im 24/33  brain]
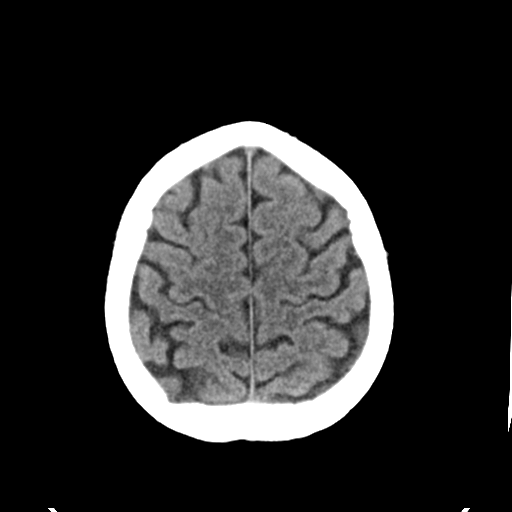
[im 25/33  brain]
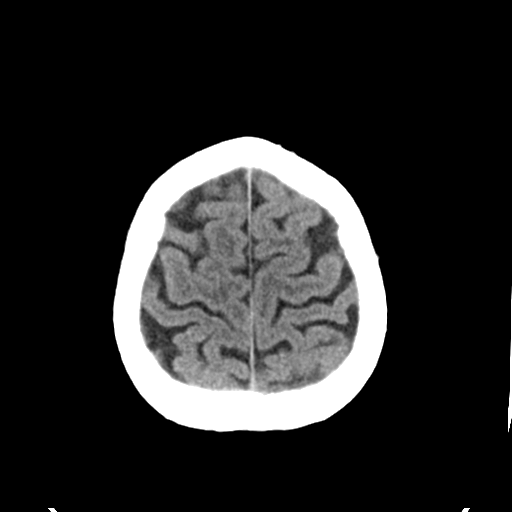
[im 25/33  bone]
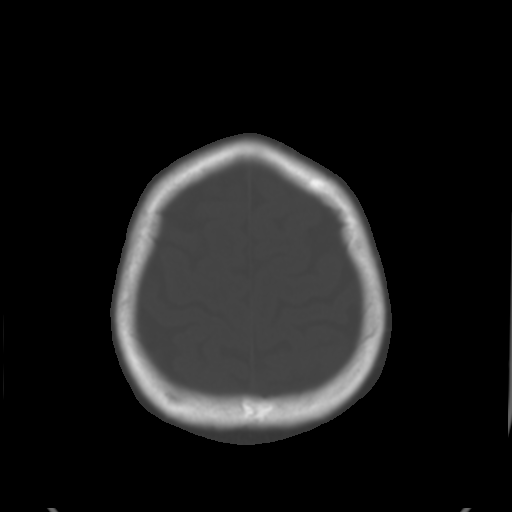
[im 27/33  brain]
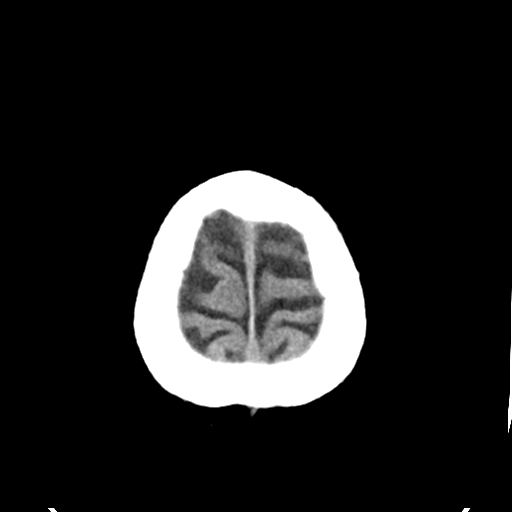
[im 29/33  brain]
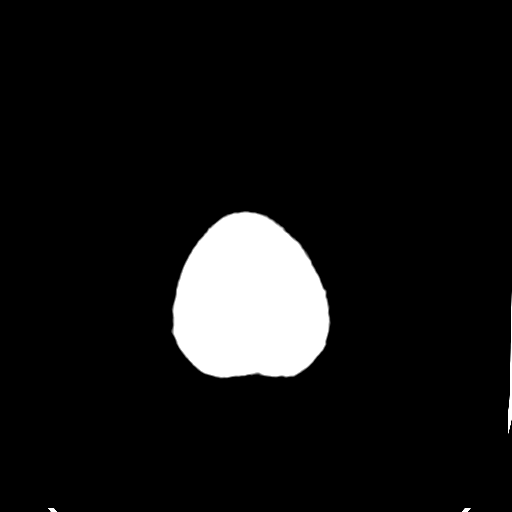
[im 31/33  brain]
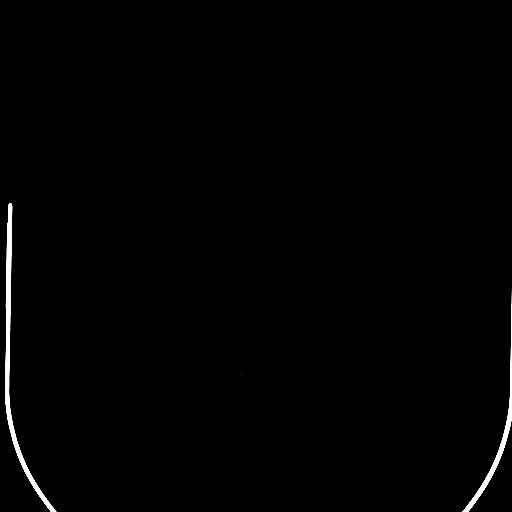

[16 of 30 positions shown; findings below may reference images not displayed]

FINDINGS: There is no evidence of acute infarction, mass lesion, or intra- or
extra-axial hemorrhage on CT.

The posterior fossa, including the cerebellum, brainstem and fourth
ventricle, is within normal limits. The third and lateral
ventricles, and basal ganglia are unremarkable in appearance. The
cerebral hemispheres are symmetric in appearance, with normal
gray-white differentiation. No mass effect or midline shift is seen.

There is no evidence of fracture; visualized osseous structures are
unremarkable in appearance. The visualized portions of the orbits
are within normal limits. The paranasal sinuses and mastoid air
cells are well-aerated. No significant soft tissue abnormalities are
seen.
IMPRESSION: Unremarkable noncontrast CT of the head.

## 2017-02-18 ENCOUNTER — Other Ambulatory Visit: Payer: Self-pay | Admitting: Gastroenterology

## 2017-02-18 DIAGNOSIS — K50919 Crohn's disease, unspecified, with unspecified complications: Secondary | ICD-10-CM

## 2017-02-19 ENCOUNTER — Ambulatory Visit
Admission: RE | Admit: 2017-02-19 | Discharge: 2017-02-19 | Disposition: A | Discharge status: Home / Self Care | Payer: BLUE CROSS/BLUE SHIELD | Source: Ambulatory Visit | Attending: Gastroenterology | Admitting: Gastroenterology

## 2017-02-19 DIAGNOSIS — K50919 Crohn's disease, unspecified, with unspecified complications: Secondary | ICD-10-CM

## 2017-02-19 MED ORDER — IOPAMIDOL (ISOVUE-300) INJECTION 61%
100.0000 mL | Freq: Once | INTRAVENOUS | Status: AC | PRN
  Administered 2017-02-19: 100 mL via INTRAVENOUS

## 2017-05-02 ENCOUNTER — Inpatient Hospital Stay (HOSPITAL_BASED_OUTPATIENT_CLINIC_OR_DEPARTMENT_OTHER)
Admission: EM | Admit: 2017-05-02 | Discharge: 2017-05-05 | DRG: 385 | Disposition: A | Discharge status: Home / Self Care | Payer: BLUE CROSS/BLUE SHIELD | Attending: Internal Medicine | Admitting: Internal Medicine

## 2017-05-02 ENCOUNTER — Emergency Department (HOSPITAL_BASED_OUTPATIENT_CLINIC_OR_DEPARTMENT_OTHER): Payer: BLUE CROSS/BLUE SHIELD

## 2017-05-02 ENCOUNTER — Encounter (HOSPITAL_BASED_OUTPATIENT_CLINIC_OR_DEPARTMENT_OTHER): Payer: Self-pay | Admitting: Emergency Medicine

## 2017-05-02 ENCOUNTER — Other Ambulatory Visit: Payer: Self-pay

## 2017-05-02 DIAGNOSIS — D509 Iron deficiency anemia, unspecified: Secondary | ICD-10-CM | POA: Diagnosis present

## 2017-05-02 DIAGNOSIS — E871 Hypo-osmolality and hyponatremia: Secondary | ICD-10-CM | POA: Diagnosis present

## 2017-05-02 DIAGNOSIS — Z79899 Other long term (current) drug therapy: Secondary | ICD-10-CM

## 2017-05-02 DIAGNOSIS — K50919 Crohn's disease, unspecified, with unspecified complications: Secondary | ICD-10-CM | POA: Diagnosis not present

## 2017-05-02 DIAGNOSIS — D899 Disorder involving the immune mechanism, unspecified: Secondary | ICD-10-CM | POA: Diagnosis not present

## 2017-05-02 DIAGNOSIS — F17200 Nicotine dependence, unspecified, uncomplicated: Secondary | ICD-10-CM | POA: Diagnosis present

## 2017-05-02 DIAGNOSIS — Z6822 Body mass index (BMI) 22.0-22.9, adult: Secondary | ICD-10-CM

## 2017-05-02 DIAGNOSIS — D72829 Elevated white blood cell count, unspecified: Secondary | ICD-10-CM | POA: Diagnosis not present

## 2017-05-02 DIAGNOSIS — K50114 Crohn's disease of large intestine with abscess: Principal | ICD-10-CM

## 2017-05-02 DIAGNOSIS — Z933 Colostomy status: Secondary | ICD-10-CM

## 2017-05-02 DIAGNOSIS — K5090 Crohn's disease, unspecified, without complications: Secondary | ICD-10-CM | POA: Diagnosis present

## 2017-05-02 DIAGNOSIS — E43 Unspecified severe protein-calorie malnutrition: Secondary | ICD-10-CM | POA: Diagnosis present

## 2017-05-02 DIAGNOSIS — Z9049 Acquired absence of other specified parts of digestive tract: Secondary | ICD-10-CM

## 2017-05-02 LAB — CBC WITH DIFFERENTIAL/PLATELET
Basophils Absolute: 0 10*3/uL (ref 0.0–0.1)
Basophils Relative: 0 %
EOS PCT: 0 %
Eosinophils Absolute: 0 10*3/uL (ref 0.0–0.7)
HCT: 37.1 % — ABNORMAL LOW (ref 39.0–52.0)
HEMOGLOBIN: 12.3 g/dL — AB (ref 13.0–17.0)
Lymphocytes Relative: 4 %
Lymphs Abs: 0.9 10*3/uL (ref 0.7–4.0)
MCH: 21.7 pg — AB (ref 26.0–34.0)
MCHC: 33.2 g/dL (ref 30.0–36.0)
MCV: 65.5 fL — AB (ref 78.0–100.0)
MONO ABS: 2 10*3/uL — AB (ref 0.1–1.0)
Monocytes Relative: 9 %
NEUTROS PCT: 87 %
Neutro Abs: 19.7 10*3/uL — ABNORMAL HIGH (ref 1.7–7.7)
Platelets: 375 10*3/uL (ref 150–400)
RBC: 5.66 MIL/uL (ref 4.22–5.81)
RDW: 19.4 % — ABNORMAL HIGH (ref 11.5–15.5)
WBC: 22.6 10*3/uL — ABNORMAL HIGH (ref 4.0–10.5)

## 2017-05-02 LAB — COMPREHENSIVE METABOLIC PANEL
ALK PHOS: 64 U/L (ref 38–126)
ALT: 14 U/L — AB (ref 17–63)
AST: 12 U/L — ABNORMAL LOW (ref 15–41)
Albumin: 3.2 g/dL — ABNORMAL LOW (ref 3.5–5.0)
Anion gap: 16 — ABNORMAL HIGH (ref 5–15)
BUN: 9 mg/dL (ref 6–20)
CALCIUM: 9.3 mg/dL (ref 8.9–10.3)
CO2: 21 mmol/L — ABNORMAL LOW (ref 22–32)
CREATININE: 1.15 mg/dL (ref 0.61–1.24)
Chloride: 96 mmol/L — ABNORMAL LOW (ref 101–111)
Glucose, Bld: 102 mg/dL — ABNORMAL HIGH (ref 65–99)
Potassium: 3.8 mmol/L (ref 3.5–5.1)
Sodium: 133 mmol/L — ABNORMAL LOW (ref 135–145)
Total Bilirubin: 1 mg/dL (ref 0.3–1.2)
Total Protein: 7.9 g/dL (ref 6.5–8.1)

## 2017-05-02 LAB — LIPASE, BLOOD: LIPASE: 31 U/L (ref 11–51)

## 2017-05-02 LAB — I-STAT CG4 LACTIC ACID, ED: LACTIC ACID, VENOUS: 1.39 mmol/L (ref 0.5–1.9)

## 2017-05-02 MED ORDER — DICYCLOMINE HCL 10 MG/ML IM SOLN
20.0000 mg | Freq: Once | INTRAMUSCULAR | Status: AC
  Administered 2017-05-02: 20 mg via INTRAMUSCULAR
  Filled 2017-05-02: qty 2

## 2017-05-02 MED ORDER — ENOXAPARIN SODIUM 40 MG/0.4ML ~~LOC~~ SOLN
40.0000 mg | SUBCUTANEOUS | Status: DC
  Administered 2017-05-02 – 2017-05-04 (×3): 40 mg via SUBCUTANEOUS
  Filled 2017-05-02 (×3): qty 0.4

## 2017-05-02 MED ORDER — HYDROMORPHONE HCL 1 MG/ML IJ SOLN
0.5000 mg | Freq: Once | INTRAMUSCULAR | Status: AC
  Administered 2017-05-02: 0.5 mg via INTRAVENOUS
  Filled 2017-05-02: qty 1

## 2017-05-02 MED ORDER — PIPERACILLIN-TAZOBACTAM 3.375 G IVPB 30 MIN
3.3750 g | Freq: Once | INTRAVENOUS | Status: AC
  Administered 2017-05-02: 3.375 g via INTRAVENOUS
  Filled 2017-05-02: qty 50

## 2017-05-02 MED ORDER — SODIUM CHLORIDE 0.9 % IV BOLUS
1000.0000 mL | Freq: Once | INTRAVENOUS | Status: AC
Start: 2017-05-02 — End: 2017-05-02
  Administered 2017-05-02: 1000 mL via INTRAVENOUS

## 2017-05-02 MED ORDER — ERGOCALCIFEROL 8000 UNIT/ML PO SOLN: 8000.0000 [IU] | Freq: Every day | ORAL | Status: DC

## 2017-05-02 MED ORDER — SODIUM CHLORIDE 0.9 % IV SOLN
INTRAVENOUS | Status: DC
  Administered 2017-05-02 – 2017-05-05 (×4): via INTRAVENOUS

## 2017-05-02 MED ORDER — PREDNISONE 20 MG PO TABS: 20.0000 mg | ORAL_TABLET | Freq: Every day | ORAL | Status: DC

## 2017-05-02 MED ORDER — POLYETHYLENE GLYCOL 3350 17 G PO PACK
17.0000 g | PACK | Freq: Every day | ORAL | Status: DC
  Administered 2017-05-02 – 2017-05-05 (×4): 17 g via ORAL
  Filled 2017-05-02 (×4): qty 1

## 2017-05-02 MED ORDER — ONDANSETRON HCL 4 MG/2ML IJ SOLN
4.0000 mg | Freq: Once | INTRAMUSCULAR | Status: AC
  Administered 2017-05-02: 4 mg via INTRAVENOUS
  Filled 2017-05-02: qty 2

## 2017-05-02 MED ORDER — PIPERACILLIN-TAZOBACTAM 3.375 G IVPB
3.3750 g | Freq: Three times a day (TID) | INTRAVENOUS | Status: DC
  Filled 2017-05-02: qty 50

## 2017-05-02 MED ORDER — FENTANYL CITRATE (PF) 100 MCG/2ML IJ SOLN
50.0000 ug | Freq: Once | INTRAMUSCULAR | Status: AC
  Administered 2017-05-02: 50 ug via INTRAVENOUS
  Filled 2017-05-02: qty 2

## 2017-05-02 MED ORDER — ACETAMINOPHEN 325 MG PO TABS: 650.0000 mg | ORAL_TABLET | Freq: Four times a day (QID) | ORAL | Status: DC | PRN

## 2017-05-02 MED ORDER — HYDROCODONE-ACETAMINOPHEN 5-325 MG PO TABS
1.0000 | ORAL_TABLET | Freq: Four times a day (QID) | ORAL | Status: DC | PRN
Start: 2017-05-02 — End: 2017-05-05
  Administered 2017-05-02 – 2017-05-04 (×4): 2 via ORAL
  Filled 2017-05-02 (×5): qty 2

## 2017-05-02 MED ORDER — PIPERACILLIN-TAZOBACTAM 3.375 G IVPB
3.3750 g | Freq: Three times a day (TID) | INTRAVENOUS | Status: DC
Start: 2017-05-02 — End: 2017-05-05
  Administered 2017-05-02 – 2017-05-05 (×9): 3.375 g via INTRAVENOUS
  Filled 2017-05-02 (×9): qty 50

## 2017-05-02 MED ORDER — IOPAMIDOL (ISOVUE-300) INJECTION 61%
100.0000 mL | Freq: Once | INTRAVENOUS | Status: AC | PRN
  Administered 2017-05-02: 100 mL via INTRAVENOUS

## 2017-05-02 MED ORDER — SODIUM CHLORIDE 0.9 % IV BOLUS
1000.0000 mL | Freq: Once | INTRAVENOUS | Status: AC
  Administered 2017-05-02: 1000 mL via INTRAVENOUS

## 2017-05-02 MED ORDER — ONDANSETRON HCL 4 MG/2ML IJ SOLN: 4.0000 mg | Freq: Four times a day (QID) | INTRAMUSCULAR | Status: DC | PRN

## 2017-05-02 MED ORDER — BOOST / RESOURCE BREEZE PO LIQD CUSTOM
1.0000 | Freq: Three times a day (TID) | ORAL | Status: DC
  Administered 2017-05-03 – 2017-05-05 (×6): 1 via ORAL

## 2017-05-02 MED ORDER — INFLIXIMAB 100 MG IV SOLR: 100.0000 mg | INTRAVENOUS | Status: DC

## 2017-05-02 NOTE — H&P (Signed)
History and Physical  Tom Ferguson QQP:[id number] DOB: 01-03-1991 DOA: 05/02/2017  Referring physician: Dr Leonette Monarch PCP: Scot Jun, FNP  Outpatient Specialists: [facility] Patient coming from: Home Chief Complaint: Nausea and vomiting  HPI: Tom Ferguson is a 27 y.o. male with medical history significant for Chron's disease status post Colostomy who presented to ED [facility] with complaints of persistent nausea and vomiting of 2 days duration. Associated with gradually worsening abdominal cramping. He denies any fevers or chills. Patient reports he stopped Remicade few months ago due to financial constraints. Restarted 4 weeks ago.  Has been followed by [facility].    ED Course: CT abdomen pelvis with contrast done in the ED revealed colitis with possible small abscess.  Patient was started on IV Zosyn in the ED empirically.  Low-grade temperature with T-max of 99.1. Laboratory studies remarkable for leukocytosis with a white count of 22K and hyponatremia with sodium of 133.  Review of Systems: Review of systems as noted in HPI.  All other systems reviewed and are negative.   Past Medical History:  Diagnosis Date  . Crohn's disease ([place])   . DVT (deep venous thrombosis) ([place])    Past Surgical History:  Procedure Laterality Date  . COLON SURGERY     colostomy  . COLOSTOMY      Social History:  reports that he has been smoking.  He has never used smokeless tobacco. He reports that he drinks alcohol. He reports that he does not use drugs.   No Known Allergies  Family History  Problem Relation Age of Onset  . Hypertension Mother   . Cancer Father     Father with non-Hodgkin's lymphoma Mother with GI disease  Prior to Admission medications   Medication Sig Start Date End Date Taking? Authorizing Provider  InFLIXimab (REMICADE IV) Inject 1 each into the vein See admin instructions. Infusion every 8 weeks   Yes [provider]  acetaminophen (TYLENOL) 325  MG tablet Take 650 mg by mouth every 6 (six) hours as needed for headache.    [provider]  Azucena Freed Serrata (BOSWELLIA PO) Take 1 tablet by mouth 2 (two) times daily.    [provider]  ergocalciferol (DRISDOL) 8000 UNIT/ML drops Take 8,000 Units by mouth daily.    [provider]  fluticasone (FLONASE) 50 MCG/ACT nasal spray Place 2 sprays into both nostrils daily. Patient not taking: Reported on 10/07/2016 07/18/16   Charlann Lange, PA-C  HYDROcodone-acetaminophen (NORCO/VICODIN) 5-325 MG tablet Take 1-2 tablets by mouth every 6 (six) hours as needed. Patient not taking: Reported on 10/07/2016 03/09/15   Blanchie Dessert, MD  ibuprofen (ADVIL,MOTRIN) 200 MG tablet Take 200 mg by mouth every 6 (six) hours as needed for headache.    [provider]  ondansetron (ZOFRAN) 4 MG tablet Take 1 tablet (4 mg total) by mouth every 6 (six) hours. Patient not taking: Reported on 10/07/2016 09/26/16   Frederica Kuster, PA-C  predniSONE (DELTASONE) 20 MG tablet 3 tablets x 3 days, 2 tablets x3 days, 1 tablet X 3 days, and  tablet x 3 days. 09/30/16   Scot Jun, FNP  Turmeric POWD 5 mLs by Does not apply route daily. Mix with 8oz of water    [provider]    Physical Exam: BP 122/70 (BP Location: Right Arm)   Pulse 70   Temp 99 F (37.2 C) (Oral)   Resp 18   Ht 6' 2"  (1.88 m)  Wt 86.2 kg (190 lb)   SpO2 97%   BMI 24.39 kg/m   General: 27 year old African-American male well-developed well-nourished in no acute distress.  Alert and oriented x3. Eyes: Anicteric sclera ENT: Mucous membrane moist with no erythema exudates Neck: No JVD or thyromegaly. Cardiovascular: Regular rate and rhythm with no rubs or gallops. Respiratory: Auscultation with no wheezes or rales Abdomen: Ostomy on the left.  With brown watery stools. Skin: No ulcerative lesions or rashes. Musculoskeletal: No lower extremity edema.  Moves all 4 extremities. Psychiatric: Mood  is appropriate for condition and setting. Neurologic: Alert and oriented X3.  No focal motor deficits.          Labs on Admission:  Basic Metabolic Panel: Recent Labs  Lab 05/02/17 0807  NA 133*  K 3.8  CL 96*  CO2 21*  GLUCOSE 102*  BUN 9  CREATININE 1.15  CALCIUM 9.3   Liver Function Tests: Recent Labs  Lab 05/02/17 0807  AST 12*  ALT 14*  ALKPHOS 64  BILITOT 1.0  PROT 7.9  ALBUMIN 3.2*   Recent Labs  Lab 05/02/17 0807  LIPASE 31   No results for input(s): AMMONIA in the last 168 hours. CBC: Recent Labs  Lab 05/02/17 0807  WBC 22.6*  NEUTROABS 19.7*  HGB 12.3*  HCT 37.1*  MCV 65.5*  PLT 375   Cardiac Enzymes: No results for input(s): CKTOTAL, CKMB, CKMBINDEX, TROPONINI in the last 168 hours.  BNP (last 3 results) No results for input(s): BNP in the last 8760 hours.  ProBNP (last 3 results) No results for input(s): PROBNP in the last 8760 hours.  CBG: No results for input(s): GLUCAP in the last 168 hours.  Radiological Exams on Admission: Ct Abdomen Pelvis W Contrast  Result Date: 05/02/2017 CLINICAL DATA:  Diffuse abdominal pain, nausea and vomiting. Leukocytosis. Colostomy for Crohn's disease placed in 2008. EXAM: CT ABDOMEN AND PELVIS WITH CONTRAST TECHNIQUE: Multidetector CT imaging of the abdomen and pelvis was performed using the standard protocol following bolus administration of intravenous contrast. CONTRAST:  141m ISOVUE-300 IOPAMIDOL (ISOVUE-300) INJECTION 61% COMPARISON:  02/19/2017. FINDINGS: Lower chest: Unremarkable. Hepatobiliary: Stable small liver cysts. Pancreas: Unremarkable. No pancreatic ductal dilatation or surrounding inflammatory changes. Spleen: Normal in size without focal abnormality. Adrenals/Urinary Tract: Adrenal glands are unremarkable. Kidneys are normal, without renal calculi, focal lesion, or hydronephrosis. Bladder is unremarkable. Stomach/Bowel: Again demonstrated are post left colectomy changes with a splenic  flexure colostomy. Progressive wall thickening and pericolonic soft tissue stranding involving the mid and distal transverse colon. It is difficult to assess for associated abscesses associated matting of the bowel loops and lack of oral contrast in that portion of the bowel. There is a suggestion possible small abscesses in that area. Unremarkable stomach and small bowel. No evidence of appendicitis. Vascular/Lymphatic: No significant vascular findings are present. No enlarged abdominal or pelvic lymph nodes. Reproductive: Prostate is unremarkable. Other: Small umbilical hernia containing fat. Musculoskeletal: Unremarkable bones. IMPRESSION: Progressive changes colitis involving the mid and distal transverse colon. This most pronounced involving the distal transverse colon with possible small abscesses in that area, difficult to assess due to matting of the bowel loops and lack of oral contrast in that portion of the bowel. Electronically Signed   By: SClaudie ReveringM.D.   On: 05/02/2017 10:43    EKG: Independently reviewed.  None available at the time of this dictation.  Assessment/Plan Present on Admission: . Exacerbation of [place]'s disease ([place]  Active Problems:   Exacerbation of  Crohn's disease ([place])  Acute exacerbation of Crohn's disease CT abdomen and pelvis with contrast done on 05/02/2017 revealed evidence of colitis involving the mid and distal transverse colon.  Possible small abscesses in that area. Consult GI in the morning.  Patient followed by [facility] Continue IV Zosyn Continue gentle IV hydration Continue Crohn's home medications Clear liquid diet as tolerated Pain management in place Bowel regimen in place to avoid opiates induced constipation  Leukocytosis Infective vs reactive from p.o. steroid use Repeat CBC in the morning Continue IV antibiotics empirically  Euvolemic hyponatremia Sodium 133 Repeat chemistry panel in the morning  Iron deficiency anemia Hemoglobin  is currently stable and appears to be at baseline Low trans sat, ferritin, and iron Start ferrous sulfate Repeat CBC in the morning    DVT prophylaxis: Lovenox subcu daily  Code Status: Full code  Family Communication: None at bedside  Disposition Plan: Admit to [place] called: None. Consult [facility] in the morning.  Admission status: Inpatient    Kayleen Memos MD [facility] Pager [phone]  If 7PM-7AM, please contact night-coverage [email or web address] Password [facility]  05/02/2017, 5:51 PM

## 2017-05-02 NOTE — ED Notes (Signed)
[facility] here at this time 

## 2017-05-02 NOTE — ED Notes (Signed)
Spoke with Nehemiah Settle in [facility] re: Zosyn 4 hr infusion vs. 30 min infusion; She will change order.

## 2017-05-02 NOTE — ED Provider Notes (Addendum)
[facility] EMERGENCY DEPARTMENT Provider Note  CSN: [id number] Arrival date & time: 05/02/17 0272  Chief Complaint(s) Abdominal Pain  HPI Tom Ferguson is a 27 y.o. male   The history is provided by the patient.  Abdominal Cramping  This is a recurrent problem. The current episode started 2 days ago. Episode frequency: intermittent. The problem has not changed since onset.Pertinent negatives include no chest pain, no headaches and no shortness of breath. Nothing aggravates the symptoms. Nothing relieves the symptoms. He has tried nothing for the symptoms.   Cramping is located in the left abdominal region near the ostomy site.  He believes that he is constipated and is having trouble passing stool through the ostomy. Patient also reports decreased ostomy output.  Output is liquidy and nonbloody.  Denies any solid output. Emptied ostomy bag last night.   He is endorsing subjective fevers and chills.  Denies any urinary symptoms.  Denies any use of narcotic pain medicine, antiemetics or antispasmodic medicine recently.  Patient reports restarting Remicade last month after several months of not being on it due to financial constraints.  In addition patient reports just finishing a course of prednisone. states that for several months he has had decreased oral tolerance which was improving however for the past 2 days, this has been worsening to the point where he is no longer able to tolerate liquid intake.   Past Medical History Past Medical History:  Diagnosis Date  . Crohn's disease (HCC)   . DVT (deep venous thrombosis) [facility])    Patient Active Problem List   Diagnosis Date Noted  . Effusion of right knee 10/30/2016  . Crohn disease (H[facility]) 09/30/2016   Home Medication(s) Prior to Admission medications   Medication Sig Start Date End Date Taking? Authorizing Provider  InFLIXimab (REMICADE IV) Inject 1 each into the vein See admin instructions. Infusion every 8 weeks   Yes  [provider]  acetaminophen (TYLENOL) 325 MG tablet Take 650 mg by mouth every 6 (six) hours as needed for headache.    [provider]  Jeanie Cooks Serrata (BOSWELLIA PO) Take 1 tablet by mouth 2 (two) times daily.    [provider]  ergocalciferol (DRISDOL) 8000 UNIT/ML drops Take 8,000 Units by mouth daily.    [provider]  fluticasone (FLONASE) 50 MCG/ACT nasal spray Place 2 sprays into both nostrils daily. Patient not taking: Reported on 10/07/2016 07/18/16   Elpidio Anis, PA-C  HYDROcodone-acetaminophen (NORCO/VICODIN) 5-325 MG tablet Take 1-2 tablets by mouth every 6 (six) hours as needed. Patient not taking: Reported on 10/07/2016 03/09/15   Gwyneth Sprout, MD  ibuprofen (ADVIL,MOTRIN) 200 MG tablet Take 200 mg by mouth every 6 (six) hours as needed for headache.    [provider]  ondansetron (ZOFRAN) 4 MG tablet Take 1 tablet (4 mg total) by mouth every 6 (six) hours. Patient not taking: Reported on 10/07/2016 09/26/16   Emi Holes, PA-C  predniSONE (DELTASONE) 20 MG tablet 3 tablets x 3 days, 2 tablets x3 days, 1 tablet X 3 days, and  tablet x 3 days. 09/30/16   Bing Neighbors, FNP  Turmeric POWD 5 mLs by Does not apply route daily. Mix with 8oz of water    [provider]  Past Surgical History Past Surgical History:  Procedure Laterality Date  . COLON SURGERY     colostomy  . COLOSTOMY     Family History Family History  Problem Relation Age of Onset  . Hypertension Mother   . Cancer Father     Social History Social History   Tobacco Use  . Smoking status: Current Some Day Smoker  . Smokeless tobacco: Never Used  Substance Use Topics  . Alcohol use: Yes    Comment: occ  . Drug use: No   Allergies Patient has no known allergies.  Review of Systems Review of Systems    Respiratory: Negative for shortness of breath.   Cardiovascular: Negative for chest pain.  Neurological: Negative for headaches.   All other systems are reviewed and are negative for acute change except as noted in the HPI  Physical Exam Vital Signs  I have reviewed the triage vital signs BP 108/72 (BP Location: Right Arm)   Pulse (!) 110   Temp 99.1 F (37.3 C) (Oral)   Resp 14   Ht 6\' 2"  (1.88 m)   Wt 86.2 kg (190 lb)   SpO2 100%   BMI 24.39 kg/m   Physical Exam  Constitutional: He is oriented to person, place, and time. He appears well-developed and well-nourished. No distress.  HENT:  Head: Normocephalic and atraumatic.  Nose: Nose normal.  Eyes: Pupils are equal, round, and reactive to light. Conjunctivae and EOM are normal. Right eye exhibits no discharge. Left eye exhibits no discharge. No scleral icterus.  Neck: Normal range of motion. Neck supple.  Cardiovascular: Normal rate and regular rhythm. Exam reveals no gallop and no friction rub.  No murmur heard. Pulmonary/Chest: Effort normal and breath sounds normal. No stridor. No respiratory distress. He has no rales.  Abdominal: Soft. He exhibits no distension. There is tenderness in the periumbilical area, left upper quadrant and left lower quadrant. There is no rigidity, no rebound, no guarding and no CVA tenderness.    Musculoskeletal: He exhibits no edema or tenderness.  Neurological: He is alert and oriented to person, place, and time.  Skin: Skin is warm and dry. No rash noted. He is not diaphoretic. No erythema.  Psychiatric: He has a normal mood and affect.  Vitals reviewed.   ED Results and Treatments Labs (all labs ordered are listed, but only abnormal results are displayed) Labs Reviewed  CBC WITH DIFFERENTIAL/PLATELET - Abnormal; Notable for the following components:      Result Value   WBC 22.6 (*)    Hemoglobin 12.3 (*)    HCT 37.1 (*)    MCV 65.5 (*)    MCH 21.7 (*)    RDW 19.4 (*)    Neutro  Abs 19.7 (*)    Monocytes Absolute 2.0 (*)    All other components within normal limits  COMPREHENSIVE METABOLIC PANEL - Abnormal; Notable for the following components:   Sodium 133 (*)    Chloride 96 (*)    CO2 21 (*)    Glucose, Bld 102 (*)    Albumin 3.2 (*)    AST 12 (*)    ALT 14 (*)    Anion gap 16 (*)    All other components within normal limits  CULTURE, BLOOD (ROUTINE X 2)  CULTURE, BLOOD (ROUTINE X 2)  LIPASE, BLOOD  I-STAT CG4 LACTIC ACID, ED  I-STAT CG4 LACTIC ACID, ED  EKG  EKG Interpretation  Date/Time:    Ventricular Rate:    PR Interval:    QRS Duration:   QT Interval:    QTC Calculation:   R Axis:     Text Interpretation:        Radiology Ct Abdomen Pelvis W Contrast  Result Date: 05/02/2017 CLINICAL DATA:  Diffuse abdominal pain, nausea and vomiting. Leukocytosis. Colostomy for Crohn's disease placed in 2008. EXAM: CT ABDOMEN AND PELVIS WITH CONTRAST TECHNIQUE: Multidetector CT imaging of the abdomen and pelvis was performed using the standard protocol following bolus administration of intravenous contrast. CONTRAST:  ISOVUE-300 IOPAMIDOL (ISOVUE-300) INJECTION 61% COMPARISON:  02/19/2017. FINDINGS: Lower chest: Unremarkable. Hepatobiliary: Stable small liver cysts. Pancreas: Unremarkable. No pancreatic ductal dilatation or surrounding inflammatory changes. Spleen: Normal in size without focal abnormality. Adrenals/Urinary Tract: Adrenal glands are unremarkable. Kidneys are normal, without renal calculi, focal lesion, or hydronephrosis. Bladder is unremarkable. Stomach/Bowel: Again demonstrated are post left colectomy changes with a splenic flexure colostomy. Progressive wall thickening and pericolonic soft tissue stranding involving the mid and distal transverse colon. It is difficult to assess for associated abscesses associated matting  of the bowel loops and lack of oral contrast in that portion of the bowel. There is a suggestion possible small abscesses in that area. Unremarkable stomach and small bowel. No evidence of appendicitis. Vascular/Lymphatic: No significant vascular findings are present. No enlarged abdominal or pelvic lymph nodes. Reproductive: Prostate is unremarkable. Other: Small umbilical hernia containing fat. Musculoskeletal: Unremarkable bones. IMPRESSION: Progressive changes colitis involving the mid and distal transverse colon. This most pronounced involving the distal transverse colon with possible small abscesses in that area, difficult to assess due to matting of the bowel loops and lack of oral contrast in that portion of the bowel. Electronically Signed   By: Beckie Salts M.D.   On: 05/02/2017 10:43   Pertinent labs & imaging results that were available during my care of the patient were reviewed by me and considered in my medical decision making (see chart for details).  Medications Ordered in ED Medications  sodium chloride 0.9 % bolus 1,000 mL (1,000 mLs Intravenous New Bag/Given 05/02/17 1102)  piperacillin-tazobactam (ZOSYN) IVPB 3.375 g (has no administration in time range)  sodium chloride 0.9 % bolus 1,000 mL (0 mLs Intravenous Stopped 05/02/17 0921)  ondansetron (ZOFRAN) injection 4 mg (4 mg Intravenous Given 05/02/17 0815)  fentaNYL (SUBLIMAZE) injection 50 mcg (50 mcg Intravenous Given 05/02/17 0816)  dicyclomine (BENTYL) injection 20 mg (20 mg Intramuscular Given 05/02/17 0819)  iopamidol (ISOVUE-300) 61 % injection 100 mL (100 mLs Intravenous Contrast Given 05/02/17 1007)  HYDROmorphone (DILAUDID) injection 0.5 mg (0.5 mg Intravenous Given 05/02/17 1104)                                                                                                                                    Procedures Procedures  (including critical care time)  Medical  Decision Making / ED Course I have reviewed the  nursing notes for this encounter and the patient's prior records (if available in EHR or on provided paperwork).  Clinical Course as of May 03 1119  Sat May 02, 2017  0810 Patient has a low-grade temp, is tachycardic but normotensive.  In obvious discomfort.  Abdomen tender to palpation surrounding the ostomy site.  Presentation is suspicious for possible Crohn's flare.  Will obtain screening labs to assess for the need of CT imaging.   [PC]  [id number] CBC with significant leukocytosis 22,000.  This may be due to recent steroid use but given his presentation Crohn's flare is also possible.  Lactic acid within normal limits.  Will obtain CT scan.    [PC]  1117 CT consistent with Crohn's flare with associated possible small abscesses.  Patient started on empiric antibiotics.  Cultures drawn.  Will discuss case with medicine for admission and further management.   [PC]    Clinical Course User Index [PC] Ahmyah Gidley, Amadeo Garnet, MD     Final Clinical Impression(s) / ED Diagnoses Final diagnoses:  Crohn's colitis, with abscess [facility])      This chart was dictated using voice recognition software.  Despite best efforts to proofread,  errors can occur which can change the documentation meaning.     Nira Conn, MD 05/02/17 1121

## 2017-05-02 NOTE — ED Notes (Signed)
Report to [facility] with [facility]. ETA of 20 minutes.

## 2017-05-02 NOTE — Progress Notes (Signed)
Pharmacy Antibiotic Note  Tom Ferguson is a 27 y.o. male admitted on 05/02/2017 with abdominal pain, cramping and vomiting. Pharmacy has been consulted for Zosyn dosing for IAI.    SCr 1.15, CrCL > 111 ml/min.  Afebrile, WBC 22.6.   Plan: Zosyn EID 3.375gm IV Q8H Pharmacy will sing off as dosage adjustment is likely unnecessary.  Thank you for the consult!   Height: 6\' 2"  (188 cm) Weight: 190 lb (86.2 kg) IBW/kg (Calculated) : 82.2  Temp (24hrs), Avg:99.1 F (37.3 C), Min:99.1 F (37.3 C), Max:99.1 F (37.3 C)  Recent Labs  Lab 05/02/17 0807 05/02/17 0811  WBC 22.6*  --   CREATININE 1.15  --   LATICACIDVEN  --  1.39    Estimated Creatinine Clearance: 111.2 mL/min (by C-G formula based on SCr of 1.15 mg/dL).    No Known Allergies   Tom Ferguson D. Laney Potash, PharmD, BCPS, BCCCP Pager:  [phone] 05/02/2017, 10:53 AM

## 2017-05-02 NOTE — ED Notes (Signed)
Patient given PO contrast for CT scan, per radiology protocol (hx crohn's disease)

## 2017-05-02 NOTE — Progress Notes (Signed)
Mr Donis, Pinder is a 27 yo male with PMH significant for chron's disease who presented to [facility] with complaints of severe abdominal pain. Has ostomy since 2007. Stopped taking Remicade for a few months due to financial constraints. Restarted last month. Onset of pain a few days ago with gradual worsening.   CT abdomen/pelvis with contrast done on 05/02/17 revealed: Progressive changes colitis involving the mid and distal transverse colon. This most pronounced involving the distal transverse colon with possible small abscesses in that area, difficult to assess due to matting of the bowel loops and lack of oral contrast in that portion of the bowel.  Admitted to Med-Surg at [facility] as inpatient status for exacerbation of chron's disease. Will consult GI once he arrives at [facility].

## 2017-05-02 NOTE — ED Triage Notes (Signed)
Abd pain and cramping with vomiting since Thursday. Hx of Crohn's

## 2017-05-02 NOTE — ED Notes (Signed)
Report to Gunnar Fusi, Charity fundraiser at [employer]

## 2017-05-02 NOTE — ED Notes (Signed)
[facility] has left with patient at this time. 

## 2017-05-02 NOTE — ED Notes (Signed)
Pt on auto VS q30 

## 2017-05-03 DIAGNOSIS — D509 Iron deficiency anemia, unspecified: Secondary | ICD-10-CM

## 2017-05-03 LAB — COMPREHENSIVE METABOLIC PANEL
ALBUMIN: 2.6 g/dL — AB (ref 3.5–5.0)
ALT: 12 U/L — ABNORMAL LOW (ref 17–63)
ANION GAP: 11 (ref 5–15)
AST: 11 U/L — AB (ref 15–41)
Alkaline Phosphatase: 58 U/L (ref 38–126)
BILIRUBIN TOTAL: 0.7 mg/dL (ref 0.3–1.2)
BUN: 7 mg/dL (ref 6–20)
CO2: 24 mmol/L (ref 22–32)
Calcium: 9 mg/dL (ref 8.9–10.3)
Chloride: 102 mmol/L (ref 101–111)
Creatinine, Ser: 0.95 mg/dL (ref 0.61–1.24)
GFR calc Af Amer: >60 mL/min (ref >60)
Glucose, Bld: 75 mg/dL (ref 65–99)
POTASSIUM: 4.8 mmol/L (ref 3.5–5.1)
Sodium: 137 mmol/L (ref 135–145)
TOTAL PROTEIN: 7 g/dL (ref 6.5–8.1)

## 2017-05-03 LAB — CBC
HCT: 36.6 % — ABNORMAL LOW (ref 39.0–52.0)
Hemoglobin: 11 g/dL — ABNORMAL LOW (ref 13.0–17.0)
MCH: 21 pg — ABNORMAL LOW (ref 26.0–34.0)
MCHC: 30.1 g/dL (ref 30.0–36.0)
MCV: 69.7 fL — ABNORMAL LOW (ref 78.0–100.0)
Platelets: 336 10*3/uL (ref 150–400)
RBC: 5.25 MIL/uL (ref 4.22–5.81)
RDW: 18.8 % — AB (ref 11.5–15.5)
WBC: 14.2 10*3/uL — AB (ref 4.0–10.5)

## 2017-05-03 MED ORDER — SIMETHICONE 80 MG PO CHEW
80.0000 mg | CHEWABLE_TABLET | Freq: Four times a day (QID) | ORAL | Status: DC | PRN
  Administered 2017-05-03: 80 mg via ORAL
  Filled 2017-05-03: qty 1

## 2017-05-03 NOTE — Progress Notes (Addendum)
PROGRESS NOTE  Acy Orsak JJH:[id number] DOB: 1990-07-15 DOA: 05/02/2017 PCP: Scot Jun, FNP  HPI/Recap of past 24 hours:  Feeling better, less pain, no n/v, no colostomy output, no fever He is worried about forming abscess   Assessment/Plan: Active Problems:   Exacerbation of Crohn's disease ([place])   Crohn's colitis, with abscess ([place])   Leukocytosis  Acute exacerbation of Crohn's disease -CT abdomen and pelvis with contrast done on 05/02/2017 revealed evidence of colitis involving the mid and distal transverse colon.  Possible small abscesses in that area. -appear improving on Continue IV Zosyn/ivf/ continue clears for now -prn analgesics and antiemetics, Bowel regimen in place to avoid opiates induced constipation -will check esr/crp -he is followed by gi Dr schooler, will contact [facility] in am  Colostomy in place  Microcytic anemia: mcv 69, hgb 11 Likely iron deficiency from chronic gi loss or malobsorption  Code Status: full  Family Communication: patient   Disposition Plan: not ready to discharge   Consultants:  none  Procedures:  none  Antibiotics:  zosyn   Objective: BP 109/67 (BP Location: Right Arm)   Pulse 68   Temp 98.6 F (37 C) (Oral)   Resp 14   Ht 6' 2"  (1.88 m)   Wt 86.2 kg (190 lb)   SpO2 98%   BMI 24.39 kg/m   Intake/Output Summary (Last 24 hours) at 05/03/2017 1710 Last data filed at 05/03/2017 1500 Gross per 24 hour  Intake 2294.85 ml  Output 1850 ml  Net 444.85 ml   Filed Weights   05/02/17 0735  Weight: 86.2 kg (190 lb)    Exam: Patient is examined daily including today on 05/03/2017, exams remain the same as of yesterday except that has changed    General:  NAD  Cardiovascular: RRR  Respiratory: CTABL  Abdomen: mild mid abdomen tenderness, no guarding, no rebound, positive BS, + colostomy, pink stoma, no output  Musculoskeletal: No Edema  Neuro: alert, oriented   Data Reviewed: Basic Metabolic  Panel: Recent Labs  Lab 05/02/17 0807 05/03/17 0433  NA 133* 137  K 3.8 4.8  CL 96* 102  CO2 21* 24  GLUCOSE 102* 75  BUN 9 7  CREATININE 1.15 0.95  CALCIUM 9.3 9.0   Liver Function Tests: Recent Labs  Lab 05/02/17 0807 05/03/17 0433  AST 12* 11*  ALT 14* 12*  ALKPHOS 64 58  BILITOT 1.0 0.7  PROT 7.9 7.0  ALBUMIN 3.2* 2.6*   Recent Labs  Lab 05/02/17 0807  LIPASE 31   No results for input(s): AMMONIA in the last 168 hours. CBC: Recent Labs  Lab 05/02/17 0807 05/03/17 0433  WBC 22.6* 14.2*  NEUTROABS 19.7*  --   HGB 12.3* 11.0*  HCT 37.1* 36.6*  MCV 65.5* 69.7*  PLT 375 336   Cardiac Enzymes:   No results for input(s): CKTOTAL, CKMB, CKMBINDEX, TROPONINI in the last 168 hours. BNP (last 3 results) No results for input(s): BNP in the last 8760 hours.  ProBNP (last 3 results) No results for input(s): PROBNP in the last 8760 hours.  CBG: No results for input(s): GLUCAP in the last 168 hours.  Recent Results (from the past 240 hour(s))  Blood culture (routine x 2)     Status: None (Preliminary result)   Collection Time: 05/02/17 11:05 AM  Result Value Ref Range Status   Specimen Description   Final    BLOOD LEFT ANTECUBITAL Performed at [facility], [address] [place], [place] [address]    Special Requests   Final    BOTTLES DRAWN AEROBIC AND ANAEROBIC Blood Culture adequate volume Performed at [facility], [address] [place], [place] [address]    Culture   Final    NO GROWTH < 24 HOURS Performed at [facility] [address], [place], [place] [address]    Report Status PENDING  Incomplete  Blood culture (routine x 2)     Status: None (Preliminary result)   Collection Time: 05/02/17 11:15 AM  Result Value Ref Range Status   Specimen Description   Final    BLOOD BLOOD LEFT HAND Performed at [facility], [address] [place], [place] [address]    Special Requests   Final    BOTTLES DRAWN  AEROBIC AND ANAEROBIC Blood Culture results may not be optimal due to an inadequate volume of blood received in culture bottles Performed at [facility], [address] [place], [place] [address]    Culture   Final    NO GROWTH < 24 HOURS Performed at [facility], [place] [address] [place], [place] [address]    Report Status PENDING  Incomplete     Studies: No results found.  Scheduled Meds: . enoxaparin (LOVENOX) injection  40 mg Subcutaneous Q24H  . feeding supplement  1 Container Oral TID BM  . polyethylene glycol  17 g Oral Daily    Continuous Infusions: . sodium chloride 75 mL/hr at 05/02/17 1835  . piperacillin-tazobactam (ZOSYN)  IV Stopped (05/03/17 1559)     Time spent: 75mns I have personally reviewed and interpreted on  05/03/2017 daily labs, tele strips, imagings as discussed above under date review session and assessment and plans.  I reviewed all nursing notes,  vitals, pertinent old records  I have discussed plan of care as described above with RN , patient on 05/03/2017   FFlorencia ReasonsMD, PhD  [facility] Pager [phone] If 7PM-7AM, please contact night-coverage at [email or web address], password [facility]4/21/2019, 5:10 PM  LOS: 1 day

## 2017-05-04 LAB — CBC WITH DIFFERENTIAL/PLATELET
BASOS ABS: 0 10*3/uL (ref 0.0–0.1)
Basophils Relative: 0 %
EOS ABS: 0 10*3/uL (ref 0.0–0.7)
Eosinophils Relative: 0 %
HEMATOCRIT: 38.7 % — AB (ref 39.0–52.0)
Hemoglobin: 11.7 g/dL — ABNORMAL LOW (ref 13.0–17.0)
LYMPHS PCT: 9 %
Lymphs Abs: 1.4 10*3/uL (ref 0.7–4.0)
MCH: 21.1 pg — ABNORMAL LOW (ref 26.0–34.0)
MCHC: 30.2 g/dL (ref 30.0–36.0)
MCV: 69.7 fL — ABNORMAL LOW (ref 78.0–100.0)
MONOS PCT: 10 %
Monocytes Absolute: 1.5 10*3/uL — ABNORMAL HIGH (ref 0.1–1.0)
NEUTROS PCT: 81 %
Neutro Abs: 12.3 10*3/uL — ABNORMAL HIGH (ref 1.7–7.7)
Platelets: 397 10*3/uL (ref 150–400)
RBC: 5.55 MIL/uL (ref 4.22–5.81)
RDW: 18.7 % — ABNORMAL HIGH (ref 11.5–15.5)
WBC: 15.2 10*3/uL — ABNORMAL HIGH (ref 4.0–10.5)

## 2017-05-04 LAB — BASIC METABOLIC PANEL
ANION GAP: 11 (ref 5–15)
BUN: <5 mg/dL — ABNORMAL LOW (ref 6–20)
CHLORIDE: 101 mmol/L (ref 101–111)
CO2: 27 mmol/L (ref 22–32)
Calcium: 9 mg/dL (ref 8.9–10.3)
Creatinine, Ser: 0.97 mg/dL (ref 0.61–1.24)
GFR calc non Af Amer: >60 mL/min (ref >60)
Glucose, Bld: 81 mg/dL (ref 65–99)
Potassium: 4.2 mmol/L (ref 3.5–5.1)
SODIUM: 139 mmol/L (ref 135–145)

## 2017-05-04 LAB — VITAMIN B12: VITAMIN B 12: 899 pg/mL (ref 180–914)

## 2017-05-04 LAB — IRON AND TIBC
IRON: 23 ug/dL — AB (ref 45–182)
SATURATION RATIOS: 17 % — AB (ref 17.9–39.5)
TIBC: 132 ug/dL — AB (ref 250–450)
UIBC: 109 ug/dL

## 2017-05-04 LAB — C-REACTIVE PROTEIN: CRP: 18.8 mg/dL — AB (ref <1.0)

## 2017-05-04 LAB — FOLATE: Folate: 9.5 ng/mL (ref >5.9)

## 2017-05-04 LAB — LACTIC ACID, PLASMA: Lactic Acid, Venous: 0.7 mmol/L (ref 0.5–1.9)

## 2017-05-04 LAB — TSH: TSH: 4.129 u[IU]/mL (ref 0.350–4.500)

## 2017-05-04 LAB — MAGNESIUM: MAGNESIUM: 2.1 mg/dL (ref 1.7–2.4)

## 2017-05-04 LAB — SEDIMENTATION RATE: SED RATE: 48 mm/h — AB (ref 0–16)

## 2017-05-04 MED ORDER — LIP MEDEX EX OINT
TOPICAL_OINTMENT | CUTANEOUS | Status: AC
  Administered 2017-05-04: 18:00:00
  Filled 2017-05-04: qty 7

## 2017-05-04 MED ORDER — METHYLPREDNISOLONE SODIUM SUCC 125 MG IJ SOLR
60.0000 mg | Freq: Two times a day (BID) | INTRAMUSCULAR | Status: DC
  Administered 2017-05-04 – 2017-05-05 (×3): 60 mg via INTRAVENOUS
  Filled 2017-05-04 (×3): qty 2

## 2017-05-04 NOTE — Progress Notes (Addendum)
Initial Nutrition Assessment  DOCUMENTATION CODES:   Severe malnutrition in context of chronic illness  INTERVENTION:   Boost Breeze po TID, each supplement provides 250 kcal and 9 grams of protein  NUTRITION DIAGNOSIS:   Severe Malnutrition related to chronic illness(Crohn's disease) as evidenced by percent weight loss, energy intake < or equal to 75% for > or equal to 1 month, mild fat depletion, mild muscle depletion.  GOAL:   Patient will meet greater than or equal to 90% of their needs  MONITOR:   PO intake, Supplement acceptance, Diet advancement, Labs, Weight trends  REASON FOR ASSESSMENT:   Malnutrition Screening Tool    ASSESSMENT:   Patient with PMH significant for Chron's disease s/p Colostomy. Reports he stopped Remicade few months ago due to financial constraints. Presents this admission with complaints of persistent nausea/vomtiing for 2 days. CT abdomen pelvis with contrast done in the ED revealed colitis with possible small abscess.    Spoke with pt at bedside. Reports he has not had anything significant to eat in four days due to ongoing nausea/vomiting. States his appetite has remained poor for over one year PTA. Pt endorses he did not have health insurance from May 2018 to March 2019 and his Crohn's symptoms worsened with each passing month. Pt typically eats two meals per day that consist of salmon, rice, pasta, and chicken. Pt cannot tolerate much dairy. Pt states his decrease in PO intake comes from his fear of eating. He is unsure how each different food will make him feel. One day he can tolerate a certain food and the next day it causes him pain. Discussed consuming smaller frequent meals and sticking to foods that he is sure he can tolerate (salmon, Boost Breeze). Pt had chicken broth last night and it made him gassy. He would like to continue with supplementation at this time.   Pt endorses a UBW of 270 lb. Records indicate pt weighed 269 lb 03/14/16 and 190  lb this admission (29.3% wt loss in one year, significant for time frame). Nutrition-Focused physical exam completed.  Medications reviewed and include: solumedrol, IV abx, NS @ 75 ml/hr Labs reviewed: BUN <5 (L)   NUTRITION - FOCUSED PHYSICAL EXAM:    Most Recent Value  Orbital Region  Mild depletion  Upper Arm Region  No depletion  Thoracic and Lumbar Region  Unable to assess  Buccal Region  No depletion  Temple Region  Mild depletion  Clavicle Bone Region  Mild depletion  Clavicle and Acromion Bone Region  No depletion  Scapular Bone Region  Unable to assess  Dorsal Hand  No depletion  Patellar Region  No depletion  Anterior Thigh Region  No depletion  Posterior Calf Region  No depletion  Edema (RD Assessment)  None     Diet Order:  Diet full liquid Room service appropriate? Yes; Fluid consistency: Thin  EDUCATION NEEDS:   Education needs have been addressed  Skin:  Skin Assessment: Reviewed RN Assessment  Last BM:  05/02/17- ostomy  Height:   Ht Readings from Last 1 Encounters:  05/02/17 6\' 2"  (1.88 m)    Weight:   Wt Readings from Last 1 Encounters:  05/02/17 190 lb (86.2 kg)    Ideal Body Weight:  86.4 kg  BMI:  Body mass index is 24.39 kg/m.  Estimated Nutritional Needs:   Kcal:  2400-2600 kcal  Protein:  120-130 g  Fluid:  >2.4 L/day    Vanessa Kick RD, LDN Clinical Nutrition Pager # -  [phone]  

## 2017-05-04 NOTE — Progress Notes (Addendum)
PROGRESS NOTE  Tom Ferguson OBS:[id number] DOB: [birth year] DOA: 05/02/2017 PCP: Scot Jun, FNP  HPI/Recap of past 24 hours:  No fever, ab pain is improving,  No n/v, no colostomy output, no fever  He is afraid to advance diet, he is worried about forming abscess   Assessment/Plan: Active Problems:   Exacerbation of Crohn's disease ([place])   Crohn's colitis, with abscess ([place])   Leukocytosis  Acute exacerbation of Crohn's disease -CT abdomen and pelvis with contrast done on 05/02/2017 revealed evidence of colitis involving the mid and distal transverse colon.  Possible small abscesses in that area. -appear improving on Continue IV Zosyn/ivf -prn analgesics and antiemetics, Bowel regimen in place to avoid opiates induced constipation -elevated esr at 48, elevated crp at 18.8 -case discussed with [facility] who recommend iv solumedrol 1m bid, continue abx, diet advancement per gi -gi input appreciated.   Colostomy in place  Microcytic anemia: mcv 69, hgb 11 Likely iron deficiency from chronic gi loss or malobsorption  Severe malnutrition in context of chronic illness Records indicate pt weighed 269 lb 03/14/16 and 190 lb this admission (29.3% wt loss in one year, significant for time frame) Nutrition input appreciated      Code Status: full  Family Communication: patient   Disposition Plan: hopefully home once able to tolerate diet advancement, need gi clearance for discharge.   Consultants:  [facility]  Procedures:  none  Antibiotics:  zosyn   Objective: BP 110/67 (BP Location: Left Arm)   Pulse 89   Temp 98.7 F (37.1 C) (Oral)   Resp 16   Ht 6' 2"  (1.88 m)   Wt 86.2 kg (190 lb)   SpO2 96%   BMI 24.39 kg/m   Intake/Output Summary (Last 24 hours) at 05/04/2017 1447 Last data filed at 05/04/2017 1400 Gross per 24 hour  Intake 4567.5 ml  Output 1950 ml  Net 2617.5 ml   Filed Weights   05/02/17 0735  Weight: 86.2 kg (190 lb)     Exam: Patient is examined daily including today on 05/04/2017, exams remain the same as of yesterday except that has changed    General:  NAD  Cardiovascular: RRR  Respiratory: CTABL  Abdomen: mild mid abdomen tenderness, no guarding, no rebound, positive BS, + colostomy, pink stoma, no output  Musculoskeletal: No Edema  Neuro: alert, oriented   Data Reviewed: Basic Metabolic Panel: Recent Labs  Lab 05/02/17 0807 05/03/17 0433 05/04/17 0424  NA 133* 137 139  K 3.8 4.8 4.2  CL 96* 102 101  CO2 21* 24 27  GLUCOSE 102* 75 81  BUN 9 7 <5*  CREATININE 1.15 0.95 0.97  CALCIUM 9.3 9.0 9.0  MG  --   --  2.1   Liver Function Tests: Recent Labs  Lab 05/02/17 0807 05/03/17 0433  AST 12* 11*  ALT 14* 12*  ALKPHOS 64 58  BILITOT 1.0 0.7  PROT 7.9 7.0  ALBUMIN 3.2* 2.6*   Recent Labs  Lab 05/02/17 0807  LIPASE 31   No results for input(s): AMMONIA in the last 168 hours. CBC: Recent Labs  Lab 05/02/17 0807 05/03/17 0433 05/04/17 0424  WBC 22.6* 14.2* 15.2*  NEUTROABS 19.7*  --  12.3*  HGB 12.3* 11.0* 11.7*  HCT 37.1* 36.6* 38.7*  MCV 65.5* 69.7* 69.7*  PLT 375 336 397   Cardiac Enzymes:   No results for input(s): CKTOTAL, CKMB, CKMBINDEX, TROPONINI in the last 168 hours. BNP (last 3 results) No results for  input(s): BNP in the last 8760 hours.  ProBNP (last 3 results) No results for input(s): PROBNP in the last 8760 hours.  CBG: No results for input(s): GLUCAP in the last 168 hours.  Recent Results (from the past 240 hour(s))  Blood culture (routine x 2)     Status: None (Preliminary result)   Collection Time: 05/02/17 11:05 AM  Result Value Ref Range Status   Specimen Description   Final    BLOOD LEFT ANTECUBITAL Performed at [facility], [address] [place], [place] [address]    Special Requests   Final    BOTTLES DRAWN AEROBIC AND ANAEROBIC Blood Culture adequate volume Performed at [facility], [address] [place], [place] [address]    Culture   Final    NO GROWTH 2 DAYS Performed at [facility], [place] [address] [place], [place] [address]    Report Status PENDING  Incomplete  Blood culture (routine x 2)     Status: None (Preliminary result)   Collection Time: 05/02/17 11:15 AM  Result Value Ref Range Status   Specimen Description   Final    BLOOD BLOOD LEFT HAND Performed at [facility], [address] [place], [place] [address]    Special Requests   Final    BOTTLES DRAWN AEROBIC AND ANAEROBIC Blood Culture results may not be optimal due to an inadequate volume of blood received in culture bottles Performed at [facility], [address] [place], [place] [address]    Culture   Final    NO GROWTH 2 DAYS Performed at [facility], [place] [address], [place], [place] [address]    Report Status PENDING  Incomplete     Studies: No results found.  Scheduled Meds: . enoxaparin (LOVENOX) injection  40 mg Subcutaneous Q24H  . feeding supplement  1 Container Oral TID BM  . methylPREDNISolone (SOLU-MEDROL) injection  60 mg Intravenous Q12H  . polyethylene glycol  17 g Oral Daily    Continuous Infusions: . sodium chloride 75 mL/hr at 05/04/17 0216  . piperacillin-tazobactam (ZOSYN)  IV 3.375 g (05/04/17 1145)     Time spent: 74mns I have personally reviewed and interpreted on  05/04/2017 daily labs, tele strips, imagings as discussed above under date review session and assessment and plans.  I reviewed all nursing notes,  vitals, pertinent old records  I have discussed plan of care as described above with RN , patient on 05/04/2017   FFlorencia ReasonsMD, PhD  [facility] Pager [phone] If 7PM-7AM, please contact night-coverage at [email or web address], password [facility]4/22/2019, 2:47 PM  LOS: 2 days

## 2017-05-04 NOTE — Consult Note (Signed)
Referring Provider: TH Primary Care Physician:  Bing Neighbors, FNP Primary Gastroenterologist:  Dr. Bosie Clos   Reason for Consultation:  Crohn's exacerbation  HPI: Tom Ferguson is a 27 y.o. male with past medical history of Crohn's colitis, diagnosed around 15 years ago, status post colon resection and left lower quadrant ostomy around 11 years ago for colon perforation admitted to the hospital for further evaluation of abdominal pain, nausea and vomiting.   Patient seen and examined at bedside. Patient was on Remicade for last 11 years, he lost his insurance in 2018 and he was without Remicade for at least 10 months.Because of ongoing symptoms he was restarted on Remicade earlier this month. Patient has completed 2 infusion and is due for four-week infusion on 05/19/2017. Patient was also using as needed prednisone.patient started noticing worsening nausea, vomiting and abdominal cramps a few days ago resulting in emergency room visit. CT scan upon initial evaluation showed worsening colitis involving the mid and distal transverse colon and there is an area of possible small abscess in the distal transverse colon.  Patient has been started on antibiotics and IV Solu-Medrol. He is feeling somewhat better now. Nausea and vomiting is resolved.  Last colonoscopy was in 2008.patient has lost around 80 pounds in last 10 months.  Past Medical History:  Diagnosis Date  . Crohn's disease ([facility])   . DVT (deep venous thrombosis) ([facility])     Past Surgical History:  Procedure Laterality Date  . COLON SURGERY     colostomy  . COLOSTOMY      Prior to Admission medications   Medication Sig Start Date End Date Taking? Authorizing Provider  InFLIXimab (REMICADE IV) Inject 1 each into the vein See admin instructions. Infusion every 8 weeks   Yes [provider]  acetaminophen (TYLENOL) 325 MG tablet Take 650 mg by mouth every 6 (six) hours as needed for headache.    [provider]   Jeanie Cooks Serrata (BOSWELLIA PO) Take 1 tablet by mouth 2 (two) times daily.    [provider]  ergocalciferol (DRISDOL) 8000 UNIT/ML drops Take 8,000 Units by mouth daily.    [provider]  fluticasone (FLONASE) 50 MCG/ACT nasal spray Place 2 sprays into both nostrils daily. Patient not taking: Reported on 10/07/2016 07/18/16   Elpidio Anis, PA-C  HYDROcodone-acetaminophen (NORCO/VICODIN) 5-325 MG tablet Take 1-2 tablets by mouth every 6 (six) hours as needed. Patient not taking: Reported on 10/07/2016 03/09/15   Gwyneth Sprout, MD  ondansetron (ZOFRAN) 4 MG tablet Take 1 tablet (4 mg total) by mouth every 6 (six) hours. Patient not taking: Reported on 10/07/2016 09/26/16   Emi Holes, PA-C  predniSONE (DELTASONE) 20 MG tablet 3 tablets x 3 days, 2 tablets x3 days, 1 tablet X 3 days, and  tablet x 3 days. Patient not taking: Reported on 05/02/2017 09/30/16   Bing Neighbors, FNP    Scheduled Meds: . enoxaparin (LOVENOX) injection  40 mg Subcutaneous Q24H  . feeding supplement  1 Container Oral TID BM  . methylPREDNISolone (SOLU-MEDROL) injection  60 mg Intravenous Q12H  . polyethylene glycol  17 g Oral Daily   Continuous Infusions: . sodium chloride 75 mL/hr at 05/04/17 0216  . piperacillin-tazobactam (ZOSYN)  IV Stopped (05/04/17 0640)   PRN Meds:.acetaminophen, HYDROcodone-acetaminophen, ondansetron (ZOFRAN) IV, simethicone  Allergies as of 05/02/2017  . (No Known Allergies)    Family History  Problem Relation Age of Onset  . Hypertension Mother   . Cancer Father  Social History   Socioeconomic History  . Marital status: Single    Spouse name: Not on file  . Number of children: Not on file  . Years of education: Not on file  . Highest education level: Not on file  Occupational History  . Not on file  Social Needs  . Financial resource strain: Not on file  . Food insecurity:    Worry: Not on file    Inability: Not on file  .  Transportation needs:    Medical: Not on file    Non-medical: Not on file  Tobacco Use  . Smoking status: Current Some Day Smoker  . Smokeless tobacco: Never Used  Substance and Sexual Activity  . Alcohol use: Yes    Comment: occ  . Drug use: No  . Sexual activity: Not on file  Lifestyle  . Physical activity:    Days per week: Not on file    Minutes per session: Not on file  . Stress: Not on file  Relationships  . Social connections:    Talks on phone: Not on file    Gets together: Not on file    Attends religious service: Not on file    Active member of club or organization: Not on file    Attends meetings of clubs or organizations: Not on file    Relationship status: Not on file  . Intimate partner violence:    Fear of current or ex partner: Not on file    Emotionally abused: Not on file    Physically abused: Not on file    Forced sexual activity: Not on file  Other Topics Concern  . Not on file  Social History Narrative  . Not on file    Review of Systems: Review of Systems  Constitutional: Positive for chills, fever and weight loss.  HENT: Negative for hearing loss and tinnitus.   Eyes: Negative for blurred vision and double vision.  Respiratory: Negative for cough and hemoptysis.   Cardiovascular: Negative for chest pain and palpitations.  Gastrointestinal: Positive for abdominal pain, nausea and vomiting. Negative for blood in stool, heartburn and melena.  Genitourinary: Negative for dysuria and urgency.  Musculoskeletal: Negative for myalgias and neck pain.  Skin: Negative for itching and rash.  Neurological: Negative for dizziness and headaches.  Endo/Heme/Allergies: Does not bruise/bleed easily.  Psychiatric/Behavioral: Negative for hallucinations and suicidal ideas.   Physical Exam: Vital signs: Vitals:   05/03/17 2234 05/04/17 0506  BP: (!) 112/46 (!) 96/54  Pulse: 87 70  Resp: 16 16  Temp: 98.5 F (36.9 C) 98.8 F (37.1 C)  SpO2: 100% 100%    Last BM Date: 05/02/17 Physical Exam  Constitutional: He is oriented to person, place, and time. He appears well-developed and well-nourished. No distress.  HENT:  Head: Normocephalic and atraumatic.  Mouth/Throat: No oropharyngeal exudate.  Eyes: EOM are normal. No scleral icterus.  Neck: Normal range of motion. Neck supple.  Cardiovascular: Normal rate, regular rhythm and normal heart sounds.  Pulmonary/Chest: Effort normal and breath sounds normal. No respiratory distress.  Abdominal: Soft. Bowel sounds are normal. He exhibits no distension. There is tenderness. There is no rebound and no guarding.  Left-sided ostomy with brown color stool. Tenderness around ostomy  as well as around periumbilical area.  Musculoskeletal: Normal range of motion. He exhibits no edema.  Neurological: He is alert and oriented to person, place, and time.  Skin: Skin is warm. No erythema.  Psychiatric: He has a normal mood and  affect. Judgment normal.  Vitals reviewed.  GI:  Lab Results: Recent Labs    05/02/17 0807 05/03/17 0433 05/04/17 0424  WBC 22.6* 14.2* 15.2*  HGB 12.3* 11.0* 11.7*  HCT 37.1* 36.6* 38.7*  PLT 375 336 397   BMET Recent Labs    05/02/17 0807 05/03/17 0433 05/04/17 0424  NA 133* 137 139  K 3.8 4.8 4.2  CL 96* 102 101  CO2 21* 24 27  GLUCOSE 102* 75 81  BUN 9 7 <5*  CREATININE 1.15 0.95 0.97  CALCIUM 9.3 9.0 9.0   LFT Recent Labs    05/03/17 0433  PROT 7.0  ALBUMIN 2.6*  AST 11*  ALT 12*  ALKPHOS 58  BILITOT 0.7   PT/INR No results for input(s): LABPROT, INR in the last 72 hours.   Studies/Results: No results found.  Impression/Plan: - Crohn's exacerbation with CT scan showing worsening colitis involving the mid and distal transverse colon and there is an area of possible small abscess in the distal transverse colon. - nausea and vomiting. Resolved - leukocytosis. . Trending down.  Recommendations ------------------------- - Continue current  management with IV antibiotics and IV Solu-Medrol.some improvement in symptoms. Nausea and vomiting  resolved.  - advance diet to full liquid. - Patient  is due for his Remicade infusion on 05/19/2017. - repeat CBC in the morning. - GI will follow.   LOS: 2 days   Kathi Der  MD, FACP 05/04/2017, 11:30 AM  Contact #  [phone]

## 2017-05-04 NOTE — Plan of Care (Signed)
Plan of care discussed.   

## 2017-05-05 DIAGNOSIS — K5090 Crohn's disease, unspecified, without complications: Secondary | ICD-10-CM

## 2017-05-05 DIAGNOSIS — E43 Unspecified severe protein-calorie malnutrition: Secondary | ICD-10-CM

## 2017-05-05 DIAGNOSIS — D899 Disorder involving the immune mechanism, unspecified: Secondary | ICD-10-CM

## 2017-05-05 LAB — BASIC METABOLIC PANEL
Anion gap: 11 (ref 5–15)
BUN: 6 mg/dL (ref 6–20)
CHLORIDE: 103 mmol/L (ref 101–111)
CO2: 24 mmol/L (ref 22–32)
Calcium: 9.1 mg/dL (ref 8.9–10.3)
Creatinine, Ser: 0.76 mg/dL (ref 0.61–1.24)
Glucose, Bld: 109 mg/dL — ABNORMAL HIGH (ref 65–99)
POTASSIUM: 4.8 mmol/L (ref 3.5–5.1)
SODIUM: 138 mmol/L (ref 135–145)

## 2017-05-05 LAB — CBC
HCT: 39.4 % (ref 39.0–52.0)
Hemoglobin: 12.6 g/dL — ABNORMAL LOW (ref 13.0–17.0)
MCH: 21.9 pg — AB (ref 26.0–34.0)
MCHC: 32 g/dL (ref 30.0–36.0)
MCV: 68.5 fL — ABNORMAL LOW (ref 78.0–100.0)
Platelets: 638 10*3/uL — ABNORMAL HIGH (ref 150–400)
RBC: 5.75 MIL/uL (ref 4.22–5.81)
RDW: 19.9 % — AB (ref 11.5–15.5)
WBC: 18.9 10*3/uL — AB (ref 4.0–10.5)

## 2017-05-05 MED ORDER — METRONIDAZOLE 500 MG PO TABS: 500.0000 mg | ORAL_TABLET | Freq: Three times a day (TID) | ORAL | 0 refills | Status: AC

## 2017-05-05 MED ORDER — PREDNISONE 20 MG PO TABS: 40.0000 mg | ORAL_TABLET | Freq: Every day | ORAL | 0 refills | Status: AC

## 2017-05-05 MED ORDER — PREDNISONE 20 MG PO TABS: 40.0000 mg | ORAL_TABLET | Freq: Every day | ORAL | Status: DC

## 2017-05-05 MED ORDER — CIPROFLOXACIN HCL 500 MG PO TABS: 500.0000 mg | ORAL_TABLET | Freq: Two times a day (BID) | ORAL | 0 refills | Status: AC

## 2017-05-05 NOTE — Progress Notes (Signed)
[facility] Gastroenterology Progress Note  Tom Ferguson 26 y.o. [birth year]  CC:  Crohn's exacerbation   Subjective: patient feeling better now. Abdominal pain is resolved. Nausea and vomiting is resolved. Able to tolerate diet.  ROS : negative for chest pain and shortness of breath.   Objective: Vital signs in last 24 hours: Vitals:   05/04/17 2023 05/05/17 0538  BP: 117/65 112/60  Pulse: 80 (!) 52  Resp: 17 17  Temp: 99.5 F (37.5 C) 98 F (36.7 C)  SpO2:  97%    Physical Exam: Constitutional: He is oriented to person, place, and time. He appears well-developed and well-nourished. No distress.  HENT:  Head: Normocephalic and atraumatic.  Mouth/Throat: No oropharyngeal exudate.  Eyes: EOM are normal. No scleral icterus.  Neck: Normal range of motion. Neck supple.  Cardiovascular: Normal rate, regular rhythm and normal heart sounds.  Pulmonary/Chest: Effort normal and breath sounds normal. No respiratory distress.  Abdominal: Soft. Bowel sounds are normal. He exhibits no distension.nontender. There is no rebound and no guarding. Left-sided ostomy with brown color stool.  LE : no edema   Lab Results: Recent Labs    05/04/17 0424 05/05/17 0439  NA 139 138  K 4.2 4.8  CL 101 103  CO2 27 24  GLUCOSE 81 109*  BUN <5* 6  CREATININE 0.97 0.76  CALCIUM 9.0 9.1  MG 2.1  --    Recent Labs    05/03/17 0433  AST 11*  ALT 12*  ALKPHOS 58  BILITOT 0.7  PROT 7.0  ALBUMIN 2.6*   Recent Labs    05/04/17 0424 05/05/17 0729  WBC 15.2* 18.9*  NEUTROABS 12.3*  --   HGB 11.7* 12.6*  HCT 38.7* 39.4  MCV 69.7* 68.5*  PLT 397 638*   No results for input(s): LABPROT, INR in the last 72 hours.    Assessment/Plan: - Crohn's exacerbation with CT scan showing worsening colitis involving the mid and distal transverse colon and there is an area of possible small abscess in the distal transverse colon. - nausea and vomiting. Resolved - leukocytosis. . Trending  down.  Recommendations ------------------------- - patient is feeling better now. Abdominal pain is resolved. Nausea and vomiting is resolved. - Advance diet to soft diet. - Change Solu-Medrol to prednisone 40 mg. Continue prednisone 40 mg for 2 weeks/ until he sees Dr. Bosie Clos.patient has a office visit scheduled with Dr. Bosie Clos next week.  - Patient  is due for his Remicade infusion on 05/19/2017. - recommend antibiotics for 10 days after discharge.( Cipro + Flagyl)  - Okay to discharge from GI standpoint if able to tolerate soft diet. GI will sign off. Call us back if needed   Kathi Der MD, FACP 05/05/2017, 9:18 AM  Contact #  [phone]

## 2017-05-05 NOTE — Discharge Summary (Signed)
Discharge Summary  Tom Ferguson OEU:[id number] DOB: 10/01/1990  PCP: Scot Jun, FNP  Admit date: 05/02/2017 Discharge date: 05/05/2017  Time spent: <60mns  Recommendations for Outpatient Follow-up:  1. F/u with [facility] Dr SMichail Sermonwithin a week  for hospital discharge follow up, repeat cbc/bmp at follow up.  Discharge Diagnoses:  Active Hospital Problems   Diagnosis Date Noted  . Protein-calorie malnutrition, severe 05/05/2017  . Exacerbation of Crohn's disease ([facility] 05/02/2017  . Crohn's colitis, with abscess ([facility]   . Leukocytosis     Resolved Hospital Problems  No resolved problems to display.    Discharge Condition: stable  Diet recommendation: soft diet  Filed Weights   05/02/17 0735 05/04/17 1729 05/05/17 0609  Weight: 86.2 kg (190 lb) 79.2 kg (174 lb 9.7 oz) 81.1 kg (178 lb 12.7 oz)    History of present illness:  PCP: HScot Jun FNP  Outpatient Specialists: ESadie [facility]erGI Patient coming from: Home Chief Complaint: Nausea and vomiting  HPI: AAdolphus Ferguson a 27y.o. male with medical history significant for Chron's disease status post Colostomy [facility] vomiting of 2 days duration. Associated with gradually worsening abdominal cramping. He denies any fevers or chills. Patient reports he stopped Remicade few months ago due to financial constraints. Restarted 4 weeks ago.  Has been followed by [facility].    ED Course: CT abdomen pelvis with contrast done in the ED revealed colitis with possible small abscess.  Patient was started on IV Zosyn in the ED empirically.  Low-grade temperature with T-max of 99.1. Laboratory studies remarkable for leukocytosis with a white count of 22K and hyponatremia with sodium of 133.     Hospital Course:  Active Problems:   Exacerbation of Crohn's disease ([facility]   [facility] ([facility]   Leukocytosis   Protein-calorie  malnutrition, severe   Acute exacerbation of Crohn's disease -CT abdomen and pelvis with contrast done on 05/02/2017 revealed evidence of colitis involving the mid and distal transverse colon. Possible small abscesses in that area. - elevated esr at 48, elevated crp at 18.8 -improving on Continue IV Zosyn/ivf, iv solumedrol, no pain, no fever, able to tolerate diet advancement -[facility] consulted, input appreciated, he is cleared to d/c home on oral abx/prednisone  Per GI recommendation. He is to follow up with GI Dr SMichail Sermon. For steroids taper and remicade.   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   Consultants:  [facility]adie HaberGI  Procedures:  none  Antibiotics:  zosyn    Discharge Exam: BP 112/60 (BP Location: Right Arm)   Pulse (!) 52   Temp 98 F (36.7 C)   Resp 17   Ht 6' 2"  (1.88 m)   Wt 81.1 kg (178 lb 12.7 oz)   SpO2 97%   BMI 22.96 kg/m   General: NAD Cardiovascular: RRR Respiratory: CTABL Ab: soft, NT/ND, +clostomy with liquid brown stool, no blood , no pus  Discharge Instructions  You were cared for by a hospitalist during your hospital stay. If you have any questions about your discharge medications or the care you received while you were in the hospital after you are discharged, you can call the unit and asked to speak with the hospitalist on call if the hospitalist that took care of you is not available. Once you are discharged, your primary care physician will handle any further  medical issues. Please note that NO REFILLS for any discharge medications will be authorized once you are discharged, as it is imperative that you return to your primary care physician (or establish a relationship with a primary care physician if you do not have one) for your aftercare needs so that they can reassess your need for medications and monitor your lab values.  Discharge Instructions    Diet general   Complete by:  As directed    Soft diet   Increase activity slowly   Complete by:  As directed      Allergies as of 05/05/2017   No Known Allergies     Medication List    STOP taking these medications   HYDROcodone-acetaminophen 5-325 MG tablet Commonly known as:  NORCO/VICODIN   ondansetron 4 MG tablet Commonly known as:  ZOFRAN     TAKE these medications   acetaminophen 325 MG tablet Commonly known as:  TYLENOL Take 650 mg by mouth every 6 (six) hours as needed for headache.   BOSWELLIA PO Take 1 tablet by mouth 2 (two) times daily.   ciprofloxacin 500 MG tablet Commonly known as:  CIPRO Take 1 tablet (500 mg total) by mouth 2 (two) times daily for 10 days.   ergocalciferol 8000 UNIT/ML drops Commonly known as:  DRISDOL Take 8,000 Units by mouth daily.   fluticasone 50 MCG/ACT nasal spray Commonly known as:  FLONASE Place 2 sprays into both nostrils daily.   metroNIDAZOLE 500 MG tablet Commonly known as:  FLAGYL Take 1 tablet (500 mg total) by mouth 3 (three) times daily for 10 days.   predniSONE 20 MG tablet Commonly known as:  DELTASONE Take 2 tablets (40 mg total) by mouth daily with breakfast for 14 days. What changed:    how much to take  how to take this  when to take this  additional instructions   REMICADE IV Inject 1 each into the vein See admin instructions. Infusion every 8 weeks      No Known Allergies Follow-up Information    Wilford Corner, MD Follow up in 2 week(s).   Specialty:  Gastroenterology Why:  hospital discharge  follow up. repeat cbc/bmp at follow up. Dr Michail Sermon to direct on prednisone duration.  Contact information: [address] [place] [address] [phone]            The results of significant diagnostics from this hospitalization (including imaging, microbiology, ancillary and laboratory) are listed below for reference.    Significant Diagnostic Studies: Ct Abdomen Pelvis W Contrast  Result Date: 05/02/2017 CLINICAL DATA:  Diffuse abdominal pain, nausea and vomiting. Leukocytosis. Colostomy for Crohn's disease placed in 2008. EXAM: CT ABDOMEN AND PELVIS WITH CONTRAST TECHNIQUE: Multidetector CT imaging of the abdomen and pelvis was performed using the standard protocol following bolus administration of intravenous contrast. CONTRAST:  134m ISOVUE-300 IOPAMIDOL (ISOVUE-300) INJECTION 61% COMPARISON:  02/19/2017. FINDINGS: Lower chest: Unremarkable. Hepatobiliary: Stable small liver cysts. Pancreas: Unremarkable. No pancreatic ductal dilatation or surrounding inflammatory changes. Spleen: Normal in size without focal abnormality. Adrenals/Urinary Tract:  Adrenal glands are unremarkable. Kidneys are normal, without renal calculi, focal lesion, or hydronephrosis. Bladder is unremarkable. Stomach/Bowel: Again demonstrated are post left colectomy changes with a splenic flexure colostomy. Progressive wall thickening and pericolonic soft tissue stranding involving the mid and distal transverse colon. It is difficult to assess for associated abscesses associated matting of the bowel loops and lack of oral contrast in that portion of the bowel. There is a suggestion possible small abscesses in that area. Unremarkable stomach and small bowel. No evidence of appendicitis. Vascular/Lymphatic: No significant vascular findings are present. No enlarged abdominal or pelvic lymph nodes. Reproductive: Prostate is unremarkable. Other: Small umbilical hernia containing fat. Musculoskeletal:  Unremarkable bones. IMPRESSION: Progressive changes colitis involving the mid and distal transverse colon. This most pronounced involving the distal transverse colon with possible small abscesses in that area, difficult to assess due to matting of the bowel loops and lack of oral contrast in that portion of the bowel. Electronically Signed   By: Claudie Revering M.D.   On: 05/02/2017 10:43    Microbiology: Recent Results (from the past 240 hour(s))  Blood culture (routine x 2)     Status: None (Preliminary result)   Collection Time: 05/02/17 11:05 AM  Result Value Ref Range Status   Specimen Description   Final    BLOOD LEFT ANTECUBITAL Performed at [facility], [address] [place], [place] [address]    Special Requests   Final    BOTTLES DRAWN AEROBIC AND ANAEROBIC Blood Culture adequate volume Performed at [facility], [address] [place], [place] [address]    Culture   Final    NO GROWTH 3 DAYS Performed at [facility], [place] [address] [place], [place] [address]    Report Status PENDING  Incomplete  Blood culture (routine x 2)     Status: None (Preliminary result)   Collection Time: 05/02/17 11:15 AM  Result Value Ref Range Status   Specimen Description   Final    BLOOD BLOOD LEFT HAND Performed at [facility], [address] [place], [place] [address]    Special Requests   Final    BOTTLES DRAWN AEROBIC AND ANAEROBIC Blood Culture results may not be optimal due to an inadequate volume of blood received in culture bottles Performed at [facility], [address] [place], [place] [address]    Culture   Final    NO GROWTH 3 DAYS Performed at [facility], [place] [address], [place], [place] [address]    Report Status PENDING  Incomplete     Labs: Basic Metabolic Panel: Recent Labs  Lab 05/02/17 0807 05/03/17 0433 05/04/17 0424 05/05/17 0439  NA 133* 137 139 138  K 3.8 4.8 4.2 4.8  CL 96* 102 101 103    CO2 21* 24 27 24   GLUCOSE 102* 75 81 109*  BUN 9 7 <5* 6  CREATININE 1.15 0.95 0.97 0.76  CALCIUM 9.3 9.0 9.0 9.1  MG  --   --  2.1  --    Liver Function Tests: Recent Labs  Lab 05/02/17 0807 05/03/17 0433  AST 12* 11*  ALT 14* 12*  ALKPHOS 64 58  BILITOT 1.0 0.7  PROT 7.9 7.0  ALBUMIN 3.2* 2.6*   Recent Labs  Lab 05/02/17 0807  LIPASE 31   No results for input(s): AMMONIA in the last 168 hours. CBC: Recent Labs  Lab 05/02/17 0807 05/03/17  8648 05/04/17 0424 05/05/17 0729  WBC 22.6* 14.2* 15.2* 18.9*  NEUTROABS 19.7*  --  12.3*  --   HGB 12.3* 11.0* 11.7* 12.6*  HCT 37.1* 36.6* 38.7* 39.4  MCV 65.5* 69.7* 69.7* 68.5*  PLT 375 336 397 638*   Cardiac Enzymes: No results for input(s): CKTOTAL, CKMB, CKMBINDEX, TROPONINI in the last 168 hours. BNP: BNP (last 3 results) No results for input(s): BNP in the last 8760 hours.  ProBNP (last 3 results) No results for input(s): PROBNP in the last 8760 hours.  CBG: No results for input(s): GLUCAP in the last 168 hours.     Signed:  Florencia Reasons MD, PhD  [facility] 05/05/2017, 11:41 AM

## 2017-05-07 LAB — CULTURE, BLOOD (ROUTINE X 2)
Culture: NO GROWTH
Culture: NO GROWTH
SPECIAL REQUESTS: ADEQUATE

## 2017-08-09 ENCOUNTER — Other Ambulatory Visit: Payer: Self-pay

## 2017-08-09 ENCOUNTER — Inpatient Hospital Stay (HOSPITAL_COMMUNITY)
Admission: EM | Admit: 2017-08-09 | Discharge: 2017-08-12 | DRG: 387 | Disposition: A | Discharge status: Home / Self Care | Payer: BLUE CROSS/BLUE SHIELD | Attending: Internal Medicine | Admitting: Internal Medicine

## 2017-08-09 ENCOUNTER — Encounter (HOSPITAL_COMMUNITY): Payer: Self-pay

## 2017-08-09 ENCOUNTER — Emergency Department (HOSPITAL_COMMUNITY): Payer: BLUE CROSS/BLUE SHIELD

## 2017-08-09 DIAGNOSIS — K529 Noninfective gastroenteritis and colitis, unspecified: Secondary | ICD-10-CM

## 2017-08-09 DIAGNOSIS — K56600 Partial intestinal obstruction, unspecified as to cause: Secondary | ICD-10-CM | POA: Diagnosis not present

## 2017-08-09 DIAGNOSIS — Z933 Colostomy status: Secondary | ICD-10-CM

## 2017-08-09 DIAGNOSIS — K5090 Crohn's disease, unspecified, without complications: Secondary | ICD-10-CM | POA: Diagnosis not present

## 2017-08-09 DIAGNOSIS — R109 Unspecified abdominal pain: Secondary | ICD-10-CM | POA: Diagnosis present

## 2017-08-09 DIAGNOSIS — D649 Anemia, unspecified: Secondary | ICD-10-CM | POA: Diagnosis present

## 2017-08-09 DIAGNOSIS — Z86718 Personal history of other venous thrombosis and embolism: Secondary | ICD-10-CM

## 2017-08-09 DIAGNOSIS — D72829 Elevated white blood cell count, unspecified: Secondary | ICD-10-CM | POA: Diagnosis not present

## 2017-08-09 DIAGNOSIS — K5651 Intestinal adhesions [bands], with partial obstruction: Secondary | ICD-10-CM

## 2017-08-09 DIAGNOSIS — K50012 Crohn's disease of small intestine with intestinal obstruction: Principal | ICD-10-CM | POA: Diagnosis present

## 2017-08-09 DIAGNOSIS — Z8249 Family history of ischemic heart disease and other diseases of the circulatory system: Secondary | ICD-10-CM | POA: Diagnosis not present

## 2017-08-09 DIAGNOSIS — Z79899 Other long term (current) drug therapy: Secondary | ICD-10-CM | POA: Diagnosis not present

## 2017-08-09 DIAGNOSIS — R1031 Right lower quadrant pain: Secondary | ICD-10-CM | POA: Diagnosis not present

## 2017-08-09 DIAGNOSIS — F17210 Nicotine dependence, cigarettes, uncomplicated: Secondary | ICD-10-CM | POA: Diagnosis present

## 2017-08-09 LAB — CBC
HEMATOCRIT: 36.1 % — AB (ref 39.0–52.0)
HEMATOCRIT: 35 % — AB (ref 39.0–52.0)
Hemoglobin: 11.1 g/dL — ABNORMAL LOW (ref 13.0–17.0)
Hemoglobin: 10.6 g/dL — ABNORMAL LOW (ref 13.0–17.0)
MCH: 21.3 pg — ABNORMAL LOW (ref 26.0–34.0)
MCH: 21.2 pg — ABNORMAL LOW (ref 26.0–34.0)
MCHC: 30.7 g/dL (ref 30.0–36.0)
MCHC: 30.3 g/dL (ref 30.0–36.0)
MCV: 69.4 fL — AB (ref 78.0–100.0)
MCV: 69.9 fL — ABNORMAL LOW (ref 78.0–100.0)
PLATELETS: 442 10*3/uL — AB (ref 150–400)
Platelets: 455 10*3/uL — ABNORMAL HIGH (ref 150–400)
RBC: 5.2 MIL/uL (ref 4.22–5.81)
RBC: 5.01 MIL/uL (ref 4.22–5.81)
RDW: 18.2 % — AB (ref 11.5–15.5)
RDW: 18.5 % — AB (ref 11.5–15.5)
WBC: 26.3 10*3/uL — ABNORMAL HIGH (ref 4.0–10.5)
WBC: 21.5 10*3/uL — AB (ref 4.0–10.5)

## 2017-08-09 LAB — COMPREHENSIVE METABOLIC PANEL
ALBUMIN: 3.6 g/dL (ref 3.5–5.0)
ALT: 11 U/L (ref 0–44)
AST: 13 U/L — AB (ref 15–41)
Alkaline Phosphatase: 52 U/L (ref 38–126)
Anion gap: 13 (ref 5–15)
BILIRUBIN TOTAL: 0.8 mg/dL (ref 0.3–1.2)
BUN: 9 mg/dL (ref 6–20)
CHLORIDE: 102 mmol/L (ref 98–111)
CO2: 27 mmol/L (ref 22–32)
CREATININE: 0.93 mg/dL (ref 0.61–1.24)
Calcium: 9.9 mg/dL (ref 8.9–10.3)
GFR calc Af Amer: >60 mL/min (ref >60)
GLUCOSE: 108 mg/dL — AB (ref 70–99)
POTASSIUM: 3.9 mmol/L (ref 3.5–5.1)
Sodium: 142 mmol/L (ref 135–145)
Total Protein: 8.3 g/dL — ABNORMAL HIGH (ref 6.5–8.1)

## 2017-08-09 LAB — URINALYSIS, ROUTINE W REFLEX MICROSCOPIC
BILIRUBIN URINE: NEGATIVE
GLUCOSE, UA: NEGATIVE mg/dL
HGB URINE DIPSTICK: NEGATIVE
KETONES UR: 80 mg/dL — AB
Leukocytes, UA: NEGATIVE
Nitrite: NEGATIVE
PH: 6 (ref 5.0–8.0)
PROTEIN: NEGATIVE mg/dL
Specific Gravity, Urine: >1.046 — ABNORMAL HIGH (ref 1.005–1.030)

## 2017-08-09 LAB — LACTIC ACID, PLASMA
LACTIC ACID, VENOUS: 0.9 mmol/L (ref 0.5–1.9)
LACTIC ACID, VENOUS: 1.1 mmol/L (ref 0.5–1.9)

## 2017-08-09 LAB — LIPASE, BLOOD: Lipase: 22 U/L (ref 11–51)

## 2017-08-09 MED ORDER — POTASSIUM CHLORIDE IN NACL 20-0.9 MEQ/L-% IV SOLN
INTRAVENOUS | Status: AC
  Administered 2017-08-09 – 2017-08-10 (×3): via INTRAVENOUS
  Filled 2017-08-09 (×4): qty 1000

## 2017-08-09 MED ORDER — METRONIDAZOLE IN NACL 5-0.79 MG/ML-% IV SOLN
500.0000 mg | Freq: Three times a day (TID) | INTRAVENOUS | Status: DC
  Administered 2017-08-09 – 2017-08-11 (×5): 500 mg via INTRAVENOUS
  Filled 2017-08-09 (×5): qty 100

## 2017-08-09 MED ORDER — ONDANSETRON 4 MG PO TBDP
4.0000 mg | ORAL_TABLET | Freq: Once | ORAL | Status: AC | PRN
  Administered 2017-08-09: 4 mg via ORAL
  Filled 2017-08-09: qty 1

## 2017-08-09 MED ORDER — ENOXAPARIN SODIUM 40 MG/0.4ML ~~LOC~~ SOLN
40.0000 mg | SUBCUTANEOUS | Status: DC
  Administered 2017-08-09 – 2017-08-11 (×3): 40 mg via SUBCUTANEOUS
  Filled 2017-08-09 (×3): qty 0.4

## 2017-08-09 MED ORDER — METHYLPREDNISOLONE SODIUM SUCC 125 MG IJ SOLR
60.0000 mg | Freq: Two times a day (BID) | INTRAMUSCULAR | Status: DC
  Administered 2017-08-09 – 2017-08-10 (×3): 60 mg via INTRAVENOUS
  Filled 2017-08-09 (×3): qty 2

## 2017-08-09 MED ORDER — MORPHINE SULFATE (PF) 4 MG/ML IV SOLN
4.0000 mg | Freq: Once | INTRAVENOUS | Status: AC
  Administered 2017-08-09: 4 mg via INTRAVENOUS
  Filled 2017-08-09: qty 1

## 2017-08-09 MED ORDER — ACETAMINOPHEN 650 MG RE SUPP: 650.0000 mg | Freq: Four times a day (QID) | RECTAL | Status: DC | PRN

## 2017-08-09 MED ORDER — MORPHINE SULFATE (PF) 2 MG/ML IV SOLN
1.0000 mg | INTRAVENOUS | Status: DC | PRN
  Administered 2017-08-09 (×2): 1 mg via INTRAVENOUS
  Filled 2017-08-09 (×2): qty 1

## 2017-08-09 MED ORDER — CIPROFLOXACIN IN D5W 400 MG/200ML IV SOLN
400.0000 mg | Freq: Two times a day (BID) | INTRAVENOUS | Status: DC
  Administered 2017-08-09 – 2017-08-10 (×3): 400 mg via INTRAVENOUS
  Filled 2017-08-09 (×3): qty 200

## 2017-08-09 MED ORDER — ONDANSETRON HCL 4 MG/2ML IJ SOLN
4.0000 mg | Freq: Once | INTRAMUSCULAR | Status: AC
  Administered 2017-08-09: 4 mg via INTRAVENOUS
  Filled 2017-08-09: qty 2

## 2017-08-09 MED ORDER — PIPERACILLIN-TAZOBACTAM 3.375 G IVPB 30 MIN
3.3750 g | Freq: Once | INTRAVENOUS | Status: AC
  Administered 2017-08-09: 3.375 g via INTRAVENOUS
  Filled 2017-08-09: qty 50

## 2017-08-09 MED ORDER — SODIUM CHLORIDE 0.9 % IV BOLUS
1000.0000 mL | Freq: Once | INTRAVENOUS | Status: AC
  Administered 2017-08-09: 1000 mL via INTRAVENOUS

## 2017-08-09 MED ORDER — PROMETHAZINE HCL 25 MG/ML IJ SOLN
12.5000 mg | Freq: Four times a day (QID) | INTRAMUSCULAR | Status: DC | PRN
  Administered 2017-08-09: 12.5 mg via INTRAVENOUS
  Filled 2017-08-09: qty 1

## 2017-08-09 MED ORDER — ONDANSETRON HCL 4 MG/2ML IJ SOLN: 4.0000 mg | Freq: Four times a day (QID) | INTRAMUSCULAR | Status: DC | PRN

## 2017-08-09 MED ORDER — ACETAMINOPHEN 325 MG PO TABS: 650.0000 mg | ORAL_TABLET | Freq: Four times a day (QID) | ORAL | Status: DC | PRN

## 2017-08-09 MED ORDER — IOPAMIDOL (ISOVUE-300) INJECTION 61%
100.0000 mL | Freq: Once | INTRAVENOUS | Status: AC | PRN
  Administered 2017-08-09: 100 mL via INTRAVENOUS

## 2017-08-09 NOTE — ED Provider Notes (Addendum)
[facility] DEPT Provider Note   CSN: [id number] Arrival date & time: 08/09/17  1301   History   Chief Complaint Chief Complaint  Patient presents with  . Abdominal Pain    HPI Tom Ferguson is a 27 y.o. male with history of Crohn's disease, colostomy, anemia is here for evaluation of nausea and vomiting that began last night.  Nonbloody, nonbilious emesis.  Associated with crampy, moderate abdominal pain that is intermittent and localized to the lower quadrants worse on the right.  Has noticed watery, brown output from ostomy in the last 2 weeks but nothing solid.  He feels like stool is stuck proximal to the colostomy.  Had chills last night.  He is towards the end of his prednisone taper from a April 2019 admission.  He completed antibiotics after discharge at that time.  Unknown fevers at home.  No chest pain, shortness of breath, cough, dysuria, hematuria, melena.  No interventions PTA.  No alleviating or aggravating factors.  HPI  Past Medical History:  Diagnosis Date  . Crohn's disease ([place])   . DVT (deep venous thrombosis) [facility])     Patient Active Problem List   Diagnosis Date Noted  . Abdominal pain 08/09/2017  . Partial small bowel obstruction ([place]) 08/09/2017  . Protein-calorie malnutrition, severe 05/05/2017  . Exacerbation of Crohn's disease ([place]) 05/02/2017  . Crohn's colitis, with abscess ([place])   . Leukocytosis   . Effusion of right knee 10/30/2016  . Crohn disease ([place]) 09/30/2016    Past Surgical History:  Procedure Laterality Date  . COLON SURGERY     colostomy  . COLOSTOMY          Home Medications    Prior to Admission medications   Medication Sig Start Date End Date Taking? Authorizing Provider  InFLIXimab (REMICADE IV) Inject 1 each into the vein See admin instructions. Infusion every 8 weeks   Yes [provider]    Family History Family History  Problem Relation Age of Onset  . Hypertension Mother     . Non-Hodgkin's lymphoma Father     Social History Social History   Tobacco Use  . Smoking status: Current Some Day Smoker  . Smokeless tobacco: Never Used  Substance Use Topics  . Alcohol use: Yes    Comment: occ  . Drug use: No     Allergies   Patient has no known allergies.   Review of Systems Review of Systems  Gastrointestinal: Positive for abdominal pain, nausea and vomiting.       Watery colostomy output  Allergic/Immunologic: Positive for immunocompromised state (long term prednisone).  All other systems reviewed and are negative.    Physical Exam Updated Vital Signs BP 123/75 (BP Location: Left Arm)   Pulse 70   Temp 98.5 F (36.9 C) (Oral)   Resp (!) 6   Ht 6' 2"  (1.88 m)   Wt 75.9 kg (167 lb 5.3 oz)   SpO2 99%   BMI 21.48 kg/m   Physical Exam  Constitutional: He is oriented to person, place, and time. He appears well-developed and well-nourished.  Looks uncomfortable but nontoxic  HENT:  Head: Normocephalic and atraumatic.  Nose: Nose normal.  Moist mucous membranes   Eyes: Pupils are equal, round, and reactive to light. Conjunctivae and EOM are normal.  Neck: Normal range of motion.  Cardiovascular: Normal rate, regular rhythm, normal heart sounds and intact distal pulses.  2+ DP and radial pulses bilaterally.  Pulmonary/Chest: Effort normal and breath  sounds normal.  Abdominal: Soft. Bowel sounds are normal. There is tenderness in the right lower quadrant, suprapubic area and left lower quadrant. There is rigidity, guarding and tenderness at McBurney's point.  Active bowel sounds to lower quadrants.  No CVA tenderness.  Watery, light brown stool in colostomy bag approximately 50 cc.  Mucosa is moist and pink.  Musculoskeletal: Normal range of motion.  Neurological: He is alert and oriented to person, place, and time.  Skin: Skin is warm and dry. Capillary refill takes less than 2 seconds.  Psychiatric: He has a normal mood and affect. His  behavior is normal. Judgment and thought content normal.  Nursing note and vitals reviewed.    ED Treatments / Results  Labs (all labs ordered are listed, but only abnormal results are displayed) Labs Reviewed  COMPREHENSIVE METABOLIC PANEL - Abnormal; Notable for the following components:      Result Value   Glucose, Bld 108 (*)    Total Protein 8.3 (*)    AST 13 (*)    All other components within normal limits  CBC - Abnormal; Notable for the following components:   WBC 26.3 (*)    Hemoglobin 11.1 (*)    HCT 36.1 (*)    MCV 69.4 (*)    MCH 21.3 (*)    RDW 18.2 (*)    Platelets 442 (*)    All other components within normal limits  CULTURE, BLOOD (ROUTINE X 2)  CULTURE, BLOOD (ROUTINE X 2)  LIPASE, BLOOD  LACTIC ACID, PLASMA  LACTIC ACID, PLASMA  URINALYSIS, ROUTINE W REFLEX MICROSCOPIC  HIV ANTIBODY (ROUTINE TESTING)  COMPREHENSIVE METABOLIC PANEL  CBC    EKG None  Radiology Ct Abdomen Pelvis W Contrast  Result Date: 08/09/2017 CLINICAL DATA:  History of Crohn's. Left lower quadrant stoma. Abdominal pain EXAM: CT ABDOMEN AND PELVIS WITH CONTRAST TECHNIQUE: Multidetector CT imaging of the abdomen and pelvis was performed using the standard protocol following bolus administration of intravenous contrast. CONTRAST:  188m ISOVUE-300 IOPAMIDOL (ISOVUE-300) INJECTION 61% COMPARISON:  05/02/2017 FINDINGS: Lower chest:  No contributory findings. Hepatobiliary: No focal liver abnormality.No evidence of biliary obstruction or stone. Pancreas: Unremarkable. Spleen: Unremarkable. Adrenals/Urinary Tract: Negative adrenals. No hydronephrosis or stone. Unremarkable bladder. Stomach/Bowel: History of Crohn's with descending colostomy and Hartman's pouch. Beginning at the mid transverse colon the distal colon is markedly thickened and hyperenhancing with indistinct wall. There are bands of soft tissue compatible with fistulae extending into the left upper quadrant soft tissues with tiny  bubbles of gas in the left subhepatic space. 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 Proximal to the thickened segment the proximal colon and small bowel is fluid-filled. Mild wall thickening at the terminal ileum without penetrating disease at this level. No free perforation is noted. Vascular/Lymphatic: No acute vascular abnormality. No mass or adenopathy. Reproductive:Negative Other: No ascites or pneumoperitoneum. Musculoskeletal: Negative.  No sacroiliitis or spondylitis noted. : 1. History of Crohn's with persistent marked distal transverse and proximal descending colitis. 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 2. Partial bowel obstruction above the inflamed segment. 3. Mild terminal ileitis, new from prior. Electronically Signed   By: JMonte FantasiaM.D.   On: 08/09/2017 16:08    Procedures Procedures (including critical care time)  Medications Ordered in ED Medications  enoxaparin (LOVENOX) injection 40 mg (has no administration in time range)  acetaminophen (TYLENOL) tablet 650 mg (has no  administration in time range)    Or  acetaminophen (TYLENOL) suppository 650 mg (has no administration in time range)  0.9 % NaCl with KCl 20 mEq/ L  infusion (has no administration in time range)  ondansetron (ZOFRAN) injection 4 mg (has no administration in time range)  promethazine (PHENERGAN) injection 12.5 mg (has no administration in time range)  morphine 2 MG/ML injection 1 mg (has no administration in time range)  methylPREDNISolone sodium succinate (SOLU-MEDROL) 125 mg/2 mL injection 60 mg (has no administration in time range)  metroNIDAZOLE (FLAGYL) IVPB 500 mg (has no administration in time range)  ciprofloxacin (CIPRO) IVPB 400 mg (has no administration in time range)  ondansetron (ZOFRAN-ODT) disintegrating tablet 4 mg (4 mg Oral Given  08/09/17 1418)  sodium chloride 0.9 % bolus 1,000 mL (0 mLs Intravenous Stopped 08/09/17 1618)  ondansetron (ZOFRAN) injection 4 mg (4 mg Intravenous Given 08/09/17 1518)  morphine 4 MG/ML injection 4 mg (4 mg Intravenous Given 08/09/17 1518)  iopamidol (ISOVUE-300) 61 % injection 100 mL (100 mLs Intravenous Contrast Given 08/09/17 1527)  piperacillin-tazobactam (ZOSYN) IVPB 3.375 g (0 g Intravenous Stopped 08/09/17 1714)     Initial Impression / Assessment and Plan / ED Course  I have reviewed the triage vital signs and the nursing notes.  Pertinent labs & imaging results that were available during my care of the patient were reviewed by me and considered in my medical decision making (see chart for details).  Clinical Course as of Aug 10 1826  Sun Aug 09, 2017  1441 WBC(!): 26.3 [CG]  1441 Hemoglobin(!): 11.1 [CG]  1632 1. History of Crohn's with persistent marked distal transverse and proximal descending colitis. 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 2. Partial bowel obstruction above the inflamed segment. 3. Mild terminal ileitis, new from prior.    CT ABDOMEN PELVIS W CONTRAST [CG]    Clinical Course User Index [CG] Kinnie Feil, PA-C   Concern for chron's complication, abscess, obstruction, appendicitis.  Low suspicion for dissection given exam and chron's history.   1540: Work up remarkable for WBC 26.3, chronic anemia Hgb 11.1. Leukocytosis may be inflammatory response vs infection vs prednisone. Afebrile.  Pending UA and CT AP.   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  Patient remains tender on exam with mild rigidity.  Will start antibiotics, consult gastroenterology and request admission. Final Clinical Impressions(s) / ED Diagnoses   1815: [facility] paged x 3 unsuccessful.   1828: Spoke to [facility] (Dr. Oletta Lamas) who will see patient in rounds tomorrow morning.   Final diagnoses:  Intestinal adhesions with partial obstruction [facility])  Ileitis    ED Discharge Orders    None         Arlean Hopping 08/09/17 1828    Maudie Flakes, MD 08/11/17 0005

## 2017-08-09 NOTE — ED Triage Notes (Signed)
He c/o generalized abd. Pain with decreased appetite and stoma output x 2 weeks. He recognizes this as a Crohn's flare. He Is in no distress.

## 2017-08-09 NOTE — H&P (Signed)
TRH H&P   Patient Demographics:    Tom Ferguson, is a 27 y.o. male  MRN: [id number]   DOB - 11-23-1990  Admit Date - 08/09/2017  Outpatient Primary MD for the patient is Scot Jun, FNP  Referring MD/NP/PA: Carmon Sails   Outpatient Specialists:    [facility]  Patient coming from: home  Chief Complaint  Patient presents with  . Abdominal Pain      HPI:    Tom Ferguson  is a 27 y.o. male,  w Chronic anemia, Crohns disease s/p colostomy, c/o n/v last nite.  nonbloody emesis.  Camping pain in the right lower abdomen as well as pressure around his ostomy.  Pt states that his ostomy output has been more watery over the past 2 weeks. .  No blood in ostomy output.  Has to empty ostomy between 3-5 times a day, which is normal for him.  Pt is currently on the last phase of prednisone.    In ED, CT abd/ pelvis 1. History of Crohn's with persistent marked distal transverse and proximal descending colitis. 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 2. Partial bowel obstruction above the inflamed segment. 3. Mild terminal ileitis, new from prior.  Na 142, K 3.9, Bun 9, 0.93 Ast 13, Alt 11 Lipase 22  Wbc 26.3, hgb 11.1, Plt 442 Mcv 69.4, Rdw 18.2  Lactic acid 1.1  Pt will be admitted for mild terminal ileitis as well as partial SBO.      Review of systems:    In addition to the HPI above No Fever-chills, No Headache, No changes with Vision or hearing, No problems swallowing food or Liquids, No Chest pain, Cough or Shortness of Breath, No dysuria, No new skin rashes or bruises, No new joints pains-aches,  No new weakness, tingling, numbness in any extremity, No recent weight gain or loss, No polyuria, polydypsia or polyphagia, No significant  Mental Stressors.  A full 10 point Review of Systems was done, except as stated above, all other Review of Systems were negative.   With Past History of the following :    Past Medical History:  Diagnosis Date  . Crohn's disease ([place])   . DVT (deep venous thrombosis) ([place])       Past Surgical History:  Procedure Laterality Date  . COLON SURGERY     colostomy  . COLOSTOMY        Social History:     Social History   Tobacco Use  . Smoking status: Current Some Day Smoker  . Smokeless tobacco: Never Used  Substance Use Topics  . Alcohol use: Yes    Comment: occ     Lives - at home  Mobility - walks by self   Family History :     Family History  Problem Relation  Age of Onset  . Hypertension Mother   . Non-Hodgkin's lymphoma Father        Home Medications:   Prior to Admission medications   Medication Sig Start Date End Date Taking? Authorizing Provider  InFLIXimab (REMICADE IV) Inject 1 each into the vein See admin instructions. Infusion every 8 weeks   Yes [provider]     Allergies:    No Known Allergies   Physical Exam:   Vitals  Blood pressure 126/75, pulse 72, temperature 97.6 F (36.4 C), temperature source Oral, resp. rate 17, SpO2 100 %.   1. General  lying in bed in NAD,    2. Normal affect and insight, Not Suicidal or Homicidal, Awake Alert, Oriented X 3.  3. No F.N deficits, ALL C.Nerves Intact, Strength 5/5 all 4 extremities, Sensation intact all 4 extremities, Plantars down going.  4. Ears and Eyes appear Normal, Conjunctivae clear, PERRLA. Moist Oral Mucosa.  5. Supple Neck, No JVD, No cervical lymphadenopathy appriciated, No Carotid Bruits.  6. Symmetrical Chest wall movement, Good air movement bilaterally, CTAB.  7. RRR, No Gallops, Rubs or Murmurs, No Parasternal Heave.  8. + colostomy, Positive Bowel Sounds, Abdomen Soft, No tenderness, No organomegaly appriciated,No rebound -guarding or rigidity.  9.  No  Cyanosis, Normal Skin Turgor, No Skin Rash or Bruise.  10. Good muscle tone,  joints appear normal , no effusions, Normal ROM.  11. No Palpable Lymph Nodes in Neck or Axillae     Data Review:    CBC Recent Labs  Lab 08/09/17 1407  WBC 26.3*  HGB 11.1*  HCT 36.1*  PLT 442*  MCV 69.4*  MCH 21.3*  MCHC 30.7  RDW 18.2*   ------------------------------------------------------------------------------------------------------------------  Chemistries  Recent Labs  Lab 08/09/17 1407  NA 142  K 3.9  CL 102  CO2 27  GLUCOSE 108*  BUN 9  CREATININE 0.93  CALCIUM 9.9  AST 13*  ALT 11  ALKPHOS 52  BILITOT 0.8   ------------------------------------------------------------------------------------------------------------------ CrCl cannot be calculated (Unknown ideal weight.). ------------------------------------------------------------------------------------------------------------------ No results for input(s): TSH, T4TOTAL, T3FREE, THYROIDAB in the last 72 hours.  Invalid input(s): FREET3  Coagulation profile No results for input(s): INR, PROTIME in the last 168 hours. ------------------------------------------------------------------------------------------------------------------- No results for input(s): DDIMER in the last 72 hours. -------------------------------------------------------------------------------------------------------------------  Cardiac Enzymes No results for input(s): CKMB, TROPONINI, MYOGLOBIN in the last 168 hours.  Invalid input(s): CK ------------------------------------------------------------------------------------------------------------------ No results found for: BNP   ---------------------------------------------------------------------------------------------------------------  Urinalysis    Component Value Date/Time   COLORURINE YELLOW 09/26/2016 [place] 09/26/2016 1035   LABSPEC 1.014 09/26/2016 1035    PHURINE 5.0 09/26/2016 1035   GLUCOSEU NEGATIVE 09/26/2016 1035   HGBUR NEGATIVE 09/26/2016 [place] 09/26/2016 1035   KETONESUR NEGATIVE 09/26/2016 1035   PROTEINUR NEGATIVE 09/26/2016 1035   NITRITE NEGATIVE 09/26/2016 1035   LEUKOCYTESUR TRACE (A) 09/26/2016 1035    ----------------------------------------------------------------------------------------------------------------   Imaging Results:    Ct Abdomen Pelvis W Contrast  Result Date: 08/09/2017 CLINICAL DATA:  History of Crohn's. Left lower quadrant stoma. Abdominal pain EXAM: CT ABDOMEN AND PELVIS WITH CONTRAST TECHNIQUE: Multidetector CT imaging of the abdomen and pelvis was performed using the standard protocol following bolus administration of intravenous contrast. CONTRAST:  144m ISOVUE-300 IOPAMIDOL (ISOVUE-300) INJECTION 61% COMPARISON:  05/02/2017 FINDINGS: Lower chest:  No contributory findings. Hepatobiliary: No focal liver abnormality.No evidence of biliary obstruction or stone. Pancreas: Unremarkable. Spleen: Unremarkable. Adrenals/Urinary Tract: Negative adrenals. No hydronephrosis or stone. Unremarkable  bladder. Stomach/Bowel: History of Crohn's with descending colostomy and Hartman's pouch. Beginning at the mid transverse colon the distal colon is markedly thickened and hyperenhancing with indistinct wall. There are bands of soft tissue compatible with fistulae extending into the left upper quadrant soft tissues with tiny bubbles of gas in the left subhepatic space. 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 Proximal to the thickened segment the proximal colon and small bowel is fluid-filled. Mild wall thickening at the terminal ileum without penetrating disease at this level. No free perforation is noted. Vascular/Lymphatic: No acute vascular abnormality. No mass or adenopathy. Reproductive:Negative Other: No ascites or pneumoperitoneum. Musculoskeletal: Negative.   No sacroiliitis or spondylitis noted. : 1. History of Crohn's with persistent marked distal transverse and proximal descending colitis. 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 2. Partial bowel obstruction above the inflamed segment. 3. Mild terminal ileitis, new from prior. Electronically Signed   By: Monte Fantasia M.D.   On: 08/09/2017 16:08       Assessment & Plan:    Principal Problem:   Abdominal pain Active Problems:   Crohn disease (HCC)   Leukocytosis   Partial small bowel obstruction (HCC)    Abdominal pain, Crohns disease exacerbation Gi pathogen panel Solumedrol 78m iv bid cipro iv, Flagyl iv pharmacy to dose Morphine sulfate 169miv q3h prn pain GI consulted by ED, appreciate input  Leukocytosis Check cbc in am  Nausea / vomitting zofran 22m58mv q6h prn  Phenergan 12.5mg82m q6h prn if zofran not working  Partial SBO NPO Ns iv If continues to have n/v, care order instruction given for NGT to low intermittent suction.   Anemia Pt is not currently on supplementation Will hold off while in acute Crohns disease exacerbation   DVT Prophylaxis Lovenox- SCDs  AM Labs Ordered, also please review Full Orders  Family Communication: Admission, patients condition and plan of care including tests being ordered have been discussed with the patient  who indicate understanding and agree with the plan and Code Status.  Code Status  FULL CODE  Likely DC to  home  Condition GUARDED    Consults called: GI by ED  Admission status: inpatient  Time spent in minutes : 60   JameJani Gravel on 08/09/2017 at 5:17 PM  Between 7am to 7pm - Pager - [phone] 7pm go to [email or web address] - password [email or web address] - Office  [phone]

## 2017-08-09 NOTE — ED Notes (Signed)
ED TO INPATIENT HANDOFF REPORT  Name/Age/Gender Phyllis Ginger 27 y.o. male  Code Status   Home/SNF/Other Home  Chief Complaint abdominal pain/emesis  Level of Care/Admitting Diagnosis ED Disposition    ED Disposition Condition [facility]: [place] [100102]  Level of Care: Telemetry [5]  Admit to tele based on following criteria: Monitor for Ischemic changes  Diagnosis: Partial small bowel obstruction [facility]) [706237]  Admitting Physician: Jani Gravel [[id number]]  Attending Physician: Jani Gravel [phone]  Estimated length of stay: past midnight tomorrow  Certification:: I certify this patient will need inpatient services for at least 2 midnights  PT Class (Do Not Modify): Inpatient [101]  PT Acc Code (Do Not Modify): Private [1]       Medical History Past Medical History:  Diagnosis Date  . Crohn's disease ([place])   . DVT (deep venous thrombosis) (HCC)     Allergies No Known Allergies  IV Location/Drains/Wounds Patient Lines/Drains/Airways Status   Active Line/Drains/Airways    Name:   Placement date:   Placement time:   Site:   Days:   Peripheral IV 08/09/17 Left Forearm   08/09/17    1413    Forearm   less than 1          Labs/Imaging Results for orders placed or performed during the hospital encounter of 08/09/17 (from the past 48 hour(s))  Lipase, blood     Status: None   Collection Time: 08/09/17  2:07 PM  Result Value Ref Range   Lipase 22 11 - 51 U/L    Comment: Performed at [facility], [place] [address] [place], [place] [address]  Comprehensive metabolic panel     Status: Abnormal   Collection Time: 08/09/17  2:07 PM  Result Value Ref Range   Sodium 142 135 - 145 mmol/L   Potassium 3.9 3.5 - 5.1 mmol/L   Chloride 102 98 - 111 mmol/L   CO2 27 22 - 32 mmol/L   Glucose, Bld 108 (H) 70 - 99 mg/dL   BUN 9 6 - 20 mg/dL   Creatinine, Ser 0.93 0.61 - 1.24 mg/dL   Calcium 9.9 8.9 - 10.3 mg/dL   Total  Protein 8.3 (H) 6.5 - 8.1 g/dL   Albumin 3.6 3.5 - 5.0 g/dL   AST 13 (L) 15 - 41 U/L   ALT 11 0 - 44 U/L   Alkaline Phosphatase 52 38 - 126 U/L   Total Bilirubin 0.8 0.3 - 1.2 mg/dL   GFR calc non Af Amer >60 >60 mL/min   GFR calc Af Amer >60 >60 mL/min    Comment: (NOTE) The eGFR has been calculated using the CKD EPI equation. This calculation has not been validated in all clinical situations. eGFR's persistently <60 mL/min signify possible Chronic Kidney Disease.    Anion gap 13 5 - 15    Comment: Performed at [facility], [place] [address] [place], [place] [address]  CBC     Status: Abnormal   Collection Time: 08/09/17  2:07 PM  Result Value Ref Range   WBC 26.3 (H) 4.0 - 10.5 K/uL   RBC 5.20 4.22 - 5.81 MIL/uL   Hemoglobin 11.1 (L) 13.0 - 17.0 g/dL   HCT 36.1 (L) 39.0 - 52.0 %   MCV 69.4 (L) 78.0 - 100.0 fL   MCH 21.3 (L) 26.0 - 34.0 pg   MCHC 30.7 30.0 - 36.0 g/dL   RDW 18.2 (H) 11.5 - 15.5 %  Platelets 442 (H) 150 - 400 K/uL    Comment: Performed at [facility], [place] [address], [place], [place] [address]  Lactic acid, plasma     Status: None   Collection Time: 08/09/17  2:53 PM  Result Value Ref Range   Lactic Acid, Venous 1.1 0.5 - 1.9 mmol/L    Comment: Performed at [facility], [place] [address], [place], [place] [address]  Lactic acid, plasma     Status: None   Collection Time: 08/09/17  2:53 PM  Result Value Ref Range   Lactic Acid, Venous 0.9 0.5 - 1.9 mmol/L    Comment: Performed at [facility], [place] [address] [place], [place] [address]   Ct Abdomen Pelvis W Contrast  Result Date: 08/09/2017 CLINICAL DATA:  History of Crohn's. Left lower quadrant stoma. Abdominal pain EXAM: CT ABDOMEN AND PELVIS WITH CONTRAST TECHNIQUE: Multidetector CT imaging of the abdomen and pelvis was performed using the standard protocol following bolus administration of intravenous contrast. CONTRAST:  149m  ISOVUE-300 IOPAMIDOL (ISOVUE-300) INJECTION 61% COMPARISON:  05/02/2017 FINDINGS: Lower chest:  No contributory findings. Hepatobiliary: No focal liver abnormality.No evidence of biliary obstruction or stone. Pancreas: Unremarkable. Spleen: Unremarkable. Adrenals/Urinary Tract: Negative adrenals. No hydronephrosis or stone. Unremarkable bladder. Stomach/Bowel: History of Crohn's with descending colostomy and Hartman's pouch. Beginning at the mid transverse colon the distal colon is markedly thickened and hyperenhancing with indistinct wall. There are bands of soft tissue compatible with fistulae extending into the left upper quadrant soft tissues with tiny bubbles of gas in the left subhepatic space. 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 Proximal to the thickened segment the proximal colon and small bowel is fluid-filled. Mild wall thickening at the terminal ileum without penetrating disease at this level. No free perforation is noted. Vascular/Lymphatic: No acute vascular abnormality. No mass or adenopathy. Reproductive:Negative Other: No ascites or pneumoperitoneum. Musculoskeletal: Negative.  No sacroiliitis or spondylitis noted. : 1. History of Crohn's with persistent marked distal transverse and proximal descending colitis. 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 2. Partial bowel obstruction above the inflamed segment. 3. Mild terminal ileitis, new from prior. Electronically Signed   By: JMonte FantasiaM.D.   On: 08/09/2017 16:08    Pending Labs Unresulted Labs (From admission, onward)   Start     Ordered   08/09/17 1421  Blood culture (routine x 2)  BLOOD CULTURE X 2,   STAT     08/09/17 1420   08/09/17 1318  Urinalysis, Routine w reflex microscopic  STAT,   STAT     08/09/17 1317      Vitals/Pain Today's Vitals   08/09/17 1314 08/09/17  1334 08/09/17 1557 08/09/17 1648  BP: 113/72   126/75  Pulse: 82   72  Resp: 18   17  Temp: 98.2 F (36.8 C)   97.6 F (36.4 C)  TempSrc: Oral   Oral  SpO2: 98%   100%  PainSc:  7  4      Isolation Precautions No active isolations  Medications Medications  ondansetron (ZOFRAN-ODT) disintegrating tablet 4 mg (4 mg Oral Given 08/09/17 1418)  sodium chloride 0.9 % bolus 1,000 mL (0 mLs Intravenous Stopped 08/09/17 1618)  ondansetron (ZOFRAN) injection 4 mg (4 mg Intravenous Given 08/09/17 1518)  morphine 4 MG/ML injection 4 mg (4 mg Intravenous Given 08/09/17 1518)  iopamidol (ISOVUE-300) 61 %  injection 100 mL (100 mLs Intravenous Contrast Given 08/09/17 1527)  piperacillin-tazobactam (ZOSYN) IVPB 3.375 g (3.375 g Intravenous New Bag/Given 08/09/17 1644)    Mobility walks

## 2017-08-09 NOTE — Progress Notes (Signed)
Pharmacy Antibiotic Note  Tom Ferguson is a 27 y.o. male admitted on 08/09/2017 with intra-abdominal infection.  Pharmacy has been consulted for ciprofloxacin dosing. Metronidazole per MD orders. Patient with acute Crohn's exacerbation.  H/o colectomy with colostomy.    Plan:  Ciprofloxacin 474m IV q12h  Pharmacy will sign-off as do not anticipate need for renal dose adjustment     Temp (24hrs), Avg:97.9 F (36.6 C), Min:97.6 F (36.4 C), Max:98.2 F (36.8 C)  Recent Labs  Lab 08/09/17 1407 08/09/17 1453  WBC 26.3*  --   CREATININE 0.93  --   LATICACIDVEN  --  0.9  1.1    CrCl cannot be calculated (Unknown ideal weight.).    No Known Allergies  Antimicrobials this admission: 7/28 ciprofloxacin 7/28 metronidazole  Dose adjustments this admission:  Microbiology results: 7/28 BCx:   Thank you for allowing pharmacy to be a part of this patient's care.  DDoreene Eland PharmD, BCPS.   Pager: [phone]/28/2019 5:48 PM

## 2017-08-10 DIAGNOSIS — D72829 Elevated white blood cell count, unspecified: Secondary | ICD-10-CM

## 2017-08-10 LAB — COMPREHENSIVE METABOLIC PANEL
ALBUMIN: 3 g/dL — AB (ref 3.5–5.0)
ALK PHOS: 47 U/L (ref 38–126)
ALT: 9 U/L (ref 0–44)
AST: 9 U/L — ABNORMAL LOW (ref 15–41)
Anion gap: 7 (ref 5–15)
BUN: 11 mg/dL (ref 6–20)
CALCIUM: 9.1 mg/dL (ref 8.9–10.3)
CHLORIDE: 106 mmol/L (ref 98–111)
CO2: 26 mmol/L (ref 22–32)
CREATININE: 0.94 mg/dL (ref 0.61–1.24)
GFR calc Af Amer: >60 mL/min (ref >60)
GFR calc non Af Amer: >60 mL/min (ref >60)
GLUCOSE: 118 mg/dL — AB (ref 70–99)
Potassium: 4.6 mmol/L (ref 3.5–5.1)
SODIUM: 139 mmol/L (ref 135–145)
Total Bilirubin: 0.8 mg/dL (ref 0.3–1.2)
Total Protein: 7 g/dL (ref 6.5–8.1)

## 2017-08-10 LAB — HIV ANTIBODY (ROUTINE TESTING W REFLEX): HIV SCREEN 4TH GENERATION: NONREACTIVE

## 2017-08-10 NOTE — Progress Notes (Signed)
Patient ID: Tom Ferguson, male   DOB: 04-30-1990, 27 y.o.   MRN: [id number]                                                                PROGRESS NOTE                                                                                                                                                                                                             Patient Demographics:    Tom Ferguson, is a 27 y.o. male, DOB - Jan 13, 1991, TKW:[id number]  Admit date - 08/09/2017   Admitting Physician Jani Gravel, MD  Outpatient Primary MD for the patient is Scot Jun, [place]  LOS - 1  Outpatient Specialists:     Chief Complaint  Patient presents with  . Abdominal Pain       Brief Narrative   27 y.o. male,  w Chronic anemia, Crohns disease s/p colostomy, c/o n/v last nite.  nonbloody emesis.  Camping pain in the right lower abdomen as well as pressure around his ostomy.  Pt states that his ostomy output has been more watery over the past 2 weeks. .  No blood in ostomy output.  Has to empty ostomy between 3-5 times a day, which is normal for him.  Pt is currently on the last phase of prednisone.    In ED, CT abd/ pelvis 1. History of Crohn's with persistent marked distal transverse and proximal descending colitis. 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 2. Partial bowel obstruction above the inflamed segment. 3. Mild terminal ileitis, new from prior.  Na 142, K 3.9, Bun 9, 0.93 Ast 13, Alt 11 Lipase 22  Wbc 26.3, hgb 11.1, Plt 442 Mcv 69.4, Rdw 18.2  Lactic acid 1.1  Pt will be admitted for mild terminal ileitis as well as partial SBO.        Subjective:    [name] today has no n/v, abd pain.  Afebrile overnite. Feels stronger, no blood in ostomy,  His ostomy output is watery   No headache, No chest pain, , No new weakness tingling or numbness, No Cough -  SOB.    Assessment  & Plan :    Principal  Problem:   Abdominal pain Active Problems:   Crohn disease (HCC)   Leukocytosis   Partial small bowel obstruction (HCC)   Ileitis    Abdominal pain, Crohns disease exacerbation Cont Solumedrol 35m iv bid cipro iv, Flagyl iv pharmacy to dose Morphine sulfate 19miv q3h prn pain GI consulted by ED, appreciate input   Anemia/Leukocytosis (improving) Check cbc in am  Nausea / vomitting resolved zofran 49m62mv q6h prn  Phenergan 12.5mg45m q6h prn if zofran not working  Partial SBO NPO Ns iv Defer to GI if needs NGT, currently not having any n/v   Anemia Pt is not currently on supplementation Will hold off while in acute Crohns disease exacerbation       Code Status :  FULL CODE  Family Communication  : w patient  Disposition Plan  : home  Barriers For Discharge :   Consults  :  GI per ED  Procedures  :   DVT Prophylaxis  :  Lovenox - SCDs   Lab Results  Component Value Date   PLT 455 (H) 08/09/2017    Antibiotics  :  Zosyn iv x1 (7/28),   Cipro/ Flagly 7/28=>  Anti-infectives (From admission, onward)   Start     Dose/Rate Route Frequency Ordered Stop   08/09/17 2000  metroNIDAZOLE (FLAGYL) IVPB 500 mg     500 mg 100 mL/hr over 60 Minutes Intravenous Every 8 hours 08/09/17 1738     08/09/17 2000  ciprofloxacin (CIPRO) IVPB 400 mg     400 mg 200 mL/hr over 60 Minutes Intravenous Every 12 hours 08/09/17 1753     08/09/17 1645  piperacillin-tazobactam (ZOSYN) IVPB 3.375 g     3.375 g 100 mL/hr over 30 Minutes Intravenous  Once 08/09/17 1632 08/09/17 1714        Objective:   Vitals:   08/09/17 1802 08/09/17 1814 08/09/17 2029 08/10/17 0444  BP: 126/75 123/75 121/76 112/68  Pulse: 72 70 93 62  Resp: 17 (!) 6 14 12   Temp: 97.6 F (36.4 C) 98.5 F (36.9 C) 98.8 F (37.1 C) 97.8 F (36.6 C)  TempSrc: Oral Oral Oral Oral  SpO2:  99% 93% 97%  Weight: 75.9 kg (167 lb 5.3 oz)   76.7 kg (169 lb 3.2  oz)  Height: 6' 2"  (1.88 m)       Wt Readings from Last 3 Encounters:  08/10/17 76.7 kg (169 lb 3.2 oz)  05/05/17 81.1 kg (178 lb 12.7 oz)  10/07/16 104.8 kg (231 lb)     Intake/Output Summary (Last 24 hours) at 08/10/2017 0737 Last data filed at 08/10/2017 0600 Gross per 24 hour  Intake 2460.13 ml  Output 225 ml  Net 2235.13 ml     Physical Exam  Awake Alert, Oriented X 3, No new F.N deficits, Normal affect [place].AT,PERRAL Supple Neck,No JVD, No cervical lymphadenopathy appriciated.  Symmetrical Chest wall movement, Good air movement bilaterally, CTAB RRR,No Gallops,Rubs or new Murmurs, No Parasternal Heave +ve B.Sounds, Abd Soft, No tenderness, No organomegaly appriciated, No rebound - guarding or rigidity. No Cyanosis, Clubbing or edema, No new Rash or bruise      Data Review:    CBC Recent Labs  Lab 08/09/17 1407 08/09/17 1836  WBC 26.3* 21.5*  HGB 11.1* 10.6*  HCT 36.1* 35.0*  PLT 442* 455*  MCV 69.4* 69.9*  MCH 21.3* 21.2*  MCHC 30.7 30.3  RDW 18.2* 18.5*    Chemistries  Recent Labs  Lab 08/09/17 1407 08/10/17  0443  NA 142 139  K 3.9 4.6  CL 102 106  CO2 27 26  GLUCOSE 108* 118*  BUN 9 11  CREATININE 0.93 0.94  CALCIUM 9.9 9.1  AST 13* 9*  ALT 11 9  ALKPHOS 52 47  BILITOT 0.8 0.8   ------------------------------------------------------------------------------------------------------------------ No results for input(s): CHOL, HDL, LDLCALC, TRIG, CHOLHDL, LDLDIRECT in the last 72 hours.  No results found for: HGBA1C ------------------------------------------------------------------------------------------------------------------ No results for input(s): TSH, T4TOTAL, T3FREE, THYROIDAB in the last 72 hours.  Invalid input(s): FREET3 ------------------------------------------------------------------------------------------------------------------ No results for input(s): VITAMINB12, FOLATE, FERRITIN, TIBC, IRON, RETICCTPCT in the last 72  hours.  Coagulation profile No results for input(s): INR, PROTIME in the last 168 hours.  No results for input(s): DDIMER in the last 72 hours.  Cardiac Enzymes No results for input(s): CKMB, TROPONINI, MYOGLOBIN in the last 168 hours.  Invalid input(s): CK ------------------------------------------------------------------------------------------------------------------ No results found for: BNP  Inpatient Medications  Scheduled Meds: . enoxaparin (LOVENOX) injection  40 mg Subcutaneous Q24H  . methylPREDNISolone (SOLU-MEDROL) injection  60 mg Intravenous Q12H   Continuous Infusions: . 0.9 % NaCl with KCl 20 mEq / L 100 mL/hr at 08/10/17 0605  . ciprofloxacin Stopped (08/09/17 2121)  . metronidazole Stopped (08/10/17 0615)   PRN Meds:.acetaminophen **OR** acetaminophen, morphine injection, ondansetron (ZOFRAN) IV, promethazine  Micro Results No results found for this or any previous visit (from the past 240 hour(s)).  Radiology Reports Ct Abdomen Pelvis W Contrast  Result Date: 08/09/2017 CLINICAL DATA:  History of Crohn's. Left lower quadrant stoma. Abdominal pain EXAM: CT ABDOMEN AND PELVIS WITH CONTRAST TECHNIQUE: Multidetector CT imaging of the abdomen and pelvis was performed using the standard protocol following bolus administration of intravenous contrast. CONTRAST:  137m ISOVUE-300 IOPAMIDOL (ISOVUE-300) INJECTION 61% COMPARISON:  05/02/2017 FINDINGS: Lower chest:  No contributory findings. Hepatobiliary: No focal liver abnormality.No evidence of biliary obstruction or stone. Pancreas: Unremarkable. Spleen: Unremarkable. Adrenals/Urinary Tract: Negative adrenals. No hydronephrosis or stone. Unremarkable bladder. Stomach/Bowel: History of Crohn's with descending colostomy and Hartman's pouch. Beginning at the mid transverse colon the distal colon is markedly thickened and hyperenhancing with indistinct wall. There are bands of soft tissue compatible with fistulae extending  into the left upper quadrant soft tissues with tiny bubbles of gas in the left subhepatic space. 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 Proximal to the thickened segment the proximal colon and small bowel is fluid-filled. Mild wall thickening at the terminal ileum without penetrating disease at this level. No free perforation is noted. Vascular/Lymphatic: No acute vascular abnormality. No mass or adenopathy. Reproductive:Negative Other: No ascites or pneumoperitoneum. Musculoskeletal: Negative.  No sacroiliitis or spondylitis noted. : 1. History of Crohn's with persistent marked distal transverse and proximal descending colitis. 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 2. Partial bowel obstruction above the inflamed segment. 3. Mild terminal ileitis, new from prior. Electronically Signed   By: JMonte FantasiaM.D.   On: 08/09/2017 16:08    Time Spent in minutes  30   JJani GravelM.D on 08/10/2017 at 7:37 AM  Between 7am to 7pm - Pager - [phone] After 7pm go to [email or web address] - password [email or web address] -  Office  [phone]

## 2017-08-10 NOTE — Consult Note (Signed)
[facility] CONSULT Reason for consult:Crohns disease  Referring Physician: [facility]. Primary GI  Dr Ethelle Lyon is an 27 y.o. male.  HPI: Patient of Dr. Kathline Magic whom has had Crohn's disease for many years and has been on Remicade stopped it for a while but it got started back 4/19.  He has had a past history of colostomy due to perforated lower colon.  Patient reports that he been doing fairly well on Remicade but started having worsening nausea bloating vague abdominal painCame into the emergency room, was found to have distal transverse and proximal descending colitis with some extraluminal gas bubbles but actually was better than the CT 4/19 above this area he had what appeared to be partial small bowel obstruction.  He has been started on antibiotics and Solu-Medrol remains n.p.o.  He tells me he has been taking the Remicade religiously and his last Remicade dose was about 3 weeks ago.  Since starting the antibiotics and Solu-Medrol last night he feels better  Past Medical History:  Diagnosis Date  . Crohn's disease ([place])   . DVT (deep venous thrombosis) ([place])     Past Surgical History:  Procedure Laterality Date  . COLON SURGERY     colostomy  . COLOSTOMY      Family History  Problem Relation Age of Onset  . Hypertension Mother   . Non-Hodgkin's lymphoma Father     Social History:  reports that he has been smoking.  He has never used smokeless tobacco. He reports that he drinks alcohol. He reports that he does not use drugs.  Allergies: No Known Allergies  Medications; Prior to Admission medications   Medication Sig Start Date End Date Taking? Authorizing Provider  InFLIXimab (REMICADE IV) Inject 1 each into the vein See admin instructions. Infusion every 8 weeks   Yes [provider]   . enoxaparin (LOVENOX) injection  40 mg Subcutaneous Q24H  . methylPREDNISolone (SOLU-MEDROL) injection  60 mg Intravenous Q12H   PRN  Meds acetaminophen **OR** acetaminophen, morphine injection, ondansetron (ZOFRAN) IV, promethazine Results for orders placed or performed during the hospital encounter of 08/09/17 (from the past 48 hour(s))  Lipase, blood     Status: None   Collection Time: 08/09/17  2:07 PM  Result Value Ref Range   Lipase 22 11 - 51 U/L    Comment: Performed at [facility], [place] [address] [place], [place] [address]  Comprehensive metabolic panel     Status: Abnormal   Collection Time: 08/09/17  2:07 PM  Result Value Ref Range   Sodium 142 135 - 145 mmol/L   Potassium 3.9 3.5 - 5.1 mmol/L   Chloride 102 98 - 111 mmol/L   CO2 27 22 - 32 mmol/L   Glucose, Bld 108 (H) 70 - 99 mg/dL   BUN 9 6 - 20 mg/dL   Creatinine, Ser 0.93 0.61 - 1.24 mg/dL   Calcium 9.9 8.9 - 10.3 mg/dL   Total Protein 8.3 (H) 6.5 - 8.1 g/dL   Albumin 3.6 3.5 - 5.0 g/dL   AST 13 (L) 15 - 41 U/L   ALT 11 0 - 44 U/L   Alkaline Phosphatase 52 38 - 126 U/L   Total Bilirubin 0.8 0.3 - 1.2 mg/dL   GFR calc non Af Amer >60 >60 mL/min   GFR calc Af Amer >60 >60 mL/min    Comment: (NOTE) The eGFR has been calculated using the CKD EPI equation. This calculation has not  been validated in all clinical situations. eGFR's persistently <60 mL/min signify possible Chronic Kidney Disease.    Anion gap 13 5 - 15    Comment: Performed at [facility], [place] [address] [place], [place] [address]  CBC     Status: Abnormal   Collection Time: 08/09/17  2:07 PM  Result Value Ref Range   WBC 26.3 (H) 4.0 - 10.5 K/uL   RBC 5.20 4.22 - 5.81 MIL/uL   Hemoglobin 11.1 (L) 13.0 - 17.0 g/dL   HCT 36.1 (L) 39.0 - 52.0 %   MCV 69.4 (L) 78.0 - 100.0 fL   MCH 21.3 (L) 26.0 - 34.0 pg   MCHC 30.7 30.0 - 36.0 g/dL   RDW 18.2 (H) 11.5 - 15.5 %   Platelets 442 (H) 150 - 400 K/uL    Comment: Performed at [facility], [place] [address] [place], [place] [address]  Blood culture (routine x 2)     Status:  None (Preliminary result)   Collection Time: 08/09/17  2:53 PM  Result Value Ref Range   Specimen Description      BLOOD LEFT HAND Performed at [place] [address] [place], [place] [address]    Special Requests      BOTTLES DRAWN AEROBIC AND ANAEROBIC Blood Culture adequate volume Performed at [place] [address] [place], [place] [address]    Culture      NO GROWTH < 24 HOURS Performed at [place] [address] [place], [place] [address]    Report Status PENDING   Blood culture (routine x 2)     Status: None (Preliminary result)   Collection Time: 08/09/17  2:53 PM  Result Value Ref Range   Specimen Description      BLOOD BLOOD RIGHT FOREARM Performed at [place] [address] [place], [place] [address]    Special Requests      BOTTLES DRAWN AEROBIC AND ANAEROBIC Blood Culture adequate volume Performed at [place] [address] [place], [place] [address]    Culture      NO GROWTH < 24 HOURS Performed at [place] [address] [place], [place] [address]    Report Status PENDING   Lactic acid, plasma     Status: None   Collection Time: 08/09/17  2:53 PM  Result Value Ref Range   Lactic Acid, Venous 1.1 0.5 - 1.9 mmol/L    Comment: Performed at [facility], [place] [address] [place], [place] [address]  Lactic acid, plasma     Status: None   Collection Time: 08/09/17  2:53 PM  Result Value Ref Range   Lactic Acid, Venous 0.9 0.5 - 1.9 mmol/L    Comment: Performed at [facility], [place] [address] [place], [place] [address]  Urinalysis, Routine w reflex microscopic     Status: Abnormal   Collection Time: 08/09/17  6:17 PM  Result Value Ref Range   Color, Urine YELLOW YELLOW   APPearance CLEAR CLEAR   Specific Gravity, Urine >1.046 (H) 1.005 - 1.030   pH 6.0 5.0 - 8.0   Glucose, UA NEGATIVE NEGATIVE mg/dL    Hgb urine dipstick NEGATIVE NEGATIVE   Bilirubin Urine NEGATIVE NEGATIVE   Ketones, ur 80 (A) NEGATIVE mg/dL   Protein, ur NEGATIVE NEGATIVE mg/dL   Nitrite NEGATIVE NEGATIVE   Leukocytes, UA NEGATIVE NEGATIVE    Comment:  Performed at [facility], [place] [address] [place], [place] [address]  HIV antibody (Routine Testing)     Status: None   Collection Time: 08/09/17  6:36 PM  Result Value Ref Range   HIV Screen 4th Generation wRfx Non Reactive Non Reactive    Comment: (NOTE) Performed At: [facility] [place], [place] [id number] Rush Farmer MD NU:[id number]   CBC     Status: Abnormal   Collection Time: 08/09/17  6:36 PM  Result Value Ref Range   WBC 21.5 (H) 4.0 - 10.5 K/uL   RBC 5.01 4.22 - 5.81 MIL/uL   Hemoglobin 10.6 (L) 13.0 - 17.0 g/dL   HCT 35.0 (L) 39.0 - 52.0 %   MCV 69.9 (L) 78.0 - 100.0 fL   MCH 21.2 (L) 26.0 - 34.0 pg   MCHC 30.3 30.0 - 36.0 g/dL   RDW 18.5 (H) 11.5 - 15.5 %   Platelets 455 (H) 150 - 400 K/uL    Comment: Performed at [facility], [place] [address] [place], [place] [address]  Comprehensive metabolic panel     Status: Abnormal   Collection Time: 08/10/17  4:43 AM  Result Value Ref Range   Sodium 139 135 - 145 mmol/L   Potassium 4.6 3.5 - 5.1 mmol/L   Chloride 106 98 - 111 mmol/L   CO2 26 22 - 32 mmol/L   Glucose, Bld 118 (H) 70 - 99 mg/dL   BUN 11 6 - 20 mg/dL   Creatinine, Ser 0.94 0.61 - 1.24 mg/dL   Calcium 9.1 8.9 - 10.3 mg/dL   Total Protein 7.0 6.5 - 8.1 g/dL   Albumin 3.0 (L) 3.5 - 5.0 g/dL   AST 9 (L) 15 - 41 U/L   ALT 9 0 - 44 U/L   Alkaline Phosphatase 47 38 - 126 U/L   Total Bilirubin 0.8 0.3 - 1.2 mg/dL   GFR calc non Af Amer >60 >60 mL/min   GFR calc Af Amer >60 >60 mL/min    Comment: (NOTE) The eGFR has been calculated using the CKD EPI equation. This calculation has not been validated in all clinical situations. eGFR's persistently <60 mL/min signify possible  Chronic Kidney Disease.    Anion gap 7 5 - 15    Comment: Performed at [facility], [place] [address] [place], [place] [address]    Ct Abdomen Pelvis W Contrast  Result Date: 08/09/2017 CLINICAL DATA:  History of Crohn's. Left lower quadrant stoma. Abdominal pain EXAM: CT ABDOMEN AND PELVIS WITH CONTRAST TECHNIQUE: Multidetector CT imaging of the abdomen and pelvis was performed using the standard protocol following bolus administration of intravenous contrast. CONTRAST:  16m ISOVUE-300 IOPAMIDOL (ISOVUE-300) INJECTION 61% COMPARISON:  05/02/2017 FINDINGS: Lower chest:  No contributory findings. Hepatobiliary: No focal liver abnormality.No evidence of biliary obstruction or stone. Pancreas: Unremarkable. Spleen: Unremarkable. Adrenals/Urinary Tract: Negative adrenals. No hydronephrosis or stone. Unremarkable bladder. Stomach/Bowel: History of Crohn's with descending colostomy and Hartman's pouch. Beginning at the mid transverse colon the distal colon is markedly thickened and hyperenhancing with indistinct wall. There are bands of soft tissue compatible with fistulae extending into the left upper quadrant soft tissues with tiny bubbles of gas in the left subhepatic space. 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 Proximal to the thickened segment the proximal colon and small bowel is fluid-filled. Mild wall thickening at the terminal ileum without penetrating disease at this level. No free perforation is noted. Vascular/Lymphatic:  No acute vascular abnormality. No mass or adenopathy. Reproductive:Negative Other: No ascites or pneumoperitoneum. Musculoskeletal: Negative.  No sacroiliitis or spondylitis noted. : 1. History of Crohn's with persistent marked distal transverse and proximal descending colitis. 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 2. Partial bowel obstruction above the inflamed segment. 3. Mild terminal ileitis, new from prior. Electronically Signed   By: Monte Fantasia M.D.   On: 08/09/2017 16:08              Blood pressure 109/67, pulse 76, temperature 98.7 F (37.1 C), temperature source Oral, resp. rate 18, height _0  (1.88 m), weight 76.7 kg (169 lb 3.2 oz), SpO2 100 %.  Physical exam:   General--African-American male no acute distress ENT--nonicteric Neck--full range of motion Heart--regular rate and rhythm without murmurs or gallops Lungs--clear Abdomen--ostomy in place in left lower quadrant minimal distention    Assessment: 1.  Crohn's disease.  Exacerbation despite regular Remicade.  This raises the possibility of the development of antibodies to Remicade.  If this is a case his medication can no longer be used.  Plan: Agree with IV antibiotics and Solu-Medrol We will check Remicade antibodies and Remicade level in the a.m. If he continues to do better would allow sips of clear liquids   Nancy Fetter 08/10/2017, 5:46 PM   This note was created using voice recognition software and minor errors may Have occurred unintentionally. Pager: [phone] If no answer or after hours call [phone]

## 2017-08-11 DIAGNOSIS — K5090 Crohn's disease, unspecified, without complications: Secondary | ICD-10-CM

## 2017-08-11 DIAGNOSIS — R1031 Right lower quadrant pain: Secondary | ICD-10-CM

## 2017-08-11 LAB — COMPREHENSIVE METABOLIC PANEL
ALBUMIN: 2.9 g/dL — AB (ref 3.5–5.0)
ALT: 10 U/L (ref 0–44)
AST: 10 U/L — AB (ref 15–41)
Alkaline Phosphatase: 37 U/L — ABNORMAL LOW (ref 38–126)
Anion gap: 9 (ref 5–15)
BUN: 10 mg/dL (ref 6–20)
CHLORIDE: 104 mmol/L (ref 98–111)
CO2: 25 mmol/L (ref 22–32)
Calcium: 9.3 mg/dL (ref 8.9–10.3)
Creatinine, Ser: 0.82 mg/dL (ref 0.61–1.24)
GFR calc Af Amer: >60 mL/min (ref >60)
GFR calc non Af Amer: >60 mL/min (ref >60)
GLUCOSE: 117 mg/dL — AB (ref 70–99)
POTASSIUM: 4.3 mmol/L (ref 3.5–5.1)
SODIUM: 138 mmol/L (ref 135–145)
Total Bilirubin: 0.2 mg/dL — ABNORMAL LOW (ref 0.3–1.2)
Total Protein: 6.8 g/dL (ref 6.5–8.1)

## 2017-08-11 LAB — CBC
HEMATOCRIT: 31.3 % — AB (ref 39.0–52.0)
Hemoglobin: 9.4 g/dL — ABNORMAL LOW (ref 13.0–17.0)
MCH: 21 pg — ABNORMAL LOW (ref 26.0–34.0)
MCHC: 30 g/dL (ref 30.0–36.0)
MCV: 70 fL — ABNORMAL LOW (ref 78.0–100.0)
PLATELETS: 408 10*3/uL — AB (ref 150–400)
RBC: 4.47 MIL/uL (ref 4.22–5.81)
RDW: 18.4 % — ABNORMAL HIGH (ref 11.5–15.5)
WBC: 10.3 10*3/uL (ref 4.0–10.5)

## 2017-08-11 MED ORDER — PREDNISONE 20 MG PO TABS
20.0000 mg | ORAL_TABLET | Freq: Two times a day (BID) | ORAL | Status: DC
  Administered 2017-08-11 – 2017-08-12 (×3): 20 mg via ORAL
  Filled 2017-08-11 (×3): qty 1

## 2017-08-11 MED ORDER — CIPROFLOXACIN HCL 500 MG PO TABS
500.0000 mg | ORAL_TABLET | Freq: Two times a day (BID) | ORAL | Status: DC
  Administered 2017-08-11 – 2017-08-12 (×3): 500 mg via ORAL
  Filled 2017-08-11 (×3): qty 1

## 2017-08-11 MED ORDER — METRONIDAZOLE 500 MG PO TABS
500.0000 mg | ORAL_TABLET | Freq: Three times a day (TID) | ORAL | Status: DC
  Administered 2017-08-11 – 2017-08-12 (×3): 500 mg via ORAL
  Filled 2017-08-11 (×3): qty 1

## 2017-08-11 NOTE — Progress Notes (Signed)
[facility] PROGRESS NOTE Subjective Patient tolerating clear liquids feels well no pain passing air  Objective: Vital signs in last 24 hours: Temp:  [97.7 F (36.5 C)-98.7 F (37.1 C)] 97.7 F (36.5 C) (07/30 0457) Pulse Rate:  [52-76] 52 (07/30 0457) Resp:  [16-18] 16 (07/30 0457) BP: (108-118)/(62-69) 108/69 (07/30 0457) SpO2:  [96 %-100 %] 98 % (07/30 0457) Weight:  [76.4 kg (168 lb 8 oz)] 76.4 kg (168 lb 8 oz) (07/30 0500) Last BM Date: 08/09/17  Intake/Output from previous day: 07/29 0701 - 07/30 0700 In: 400 [IV Piggyback:400] Out: 1100 [Urine:950; Stool:150] Intake/Output this shift: Total I/O In: 400 [IV Piggyback:400] Out: 1100 [Urine:950; Stool:150]  PE: General--no distress  Abdomen--nondistended and nontender  Lab Results: Recent Labs    08/09/17 1407 08/09/17 1836 08/11/17 0524  WBC 26.3* 21.5* 10.3  HGB 11.1* 10.6* 9.4*  HCT 36.1* 35.0* 31.3*  PLT 442* 455* 408*   BMET Recent Labs    08/09/17 1407 08/10/17 0443 08/11/17 0524  NA 142 139 138  K 3.9 4.6 4.3  CL 102 106 104  CO2 27 26 25   CREATININE 0.93 0.94 0.82   LFT Recent Labs    08/09/17 1407 08/10/17 0443 08/11/17 0524  PROT 8.3* 7.0 6.8  AST 13* 9* 10*  ALT 11 9 10   ALKPHOS 52 47 37*  BILITOT 0.8 0.8 0.2*   PT/INR No results for input(s): LABPROT, INR in the last 72 hours. PANCREAS Recent Labs    08/09/17 1407  LIPASE 22         Studies/Results: Ct Abdomen Pelvis W Contrast  Result Date: 08/09/2017 CLINICAL DATA:  History of Crohn's. Left lower quadrant stoma. Abdominal pain EXAM: CT ABDOMEN AND PELVIS WITH CONTRAST TECHNIQUE: Multidetector CT imaging of the abdomen and pelvis was performed using the standard protocol following bolus administration of intravenous contrast. CONTRAST:  ISOVUE-300 IOPAMIDOL (ISOVUE-300) INJECTION 61% COMPARISON:  05/02/2017 FINDINGS: Lower chest:  No contributory findings. Hepatobiliary: No focal liver abnormality.No  evidence of biliary obstruction or stone. Pancreas: Unremarkable. Spleen: Unremarkable. Adrenals/Urinary Tract: Negative adrenals. No hydronephrosis or stone. Unremarkable bladder. Stomach/Bowel: History of Crohn's with descending colostomy and Hartman's pouch. Beginning at the mid transverse colon the distal colon is markedly thickened and hyperenhancing with indistinct wall. There are bands of soft tissue compatible with fistulae extending into the left upper quadrant soft tissues with tiny bubbles of gas in the left subhepatic space. 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 Proximal to the thickened segment the proximal colon and small bowel is fluid-filled. Mild wall thickening at the terminal ileum without penetrating disease at this level. No free perforation is noted. Vascular/Lymphatic: No acute vascular abnormality. No mass or adenopathy. Reproductive:Negative Other: No ascites or pneumoperitoneum. Musculoskeletal: Negative.  No sacroiliitis or spondylitis noted. : 1. History of Crohn's with persistent marked distal transverse and proximal descending colitis. 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 2. Partial bowel obstruction above the inflamed segment. 3. Mild terminal ileitis, new from prior. Electronically Signed   By: Marnee Spring M.D.   On: 08/09/2017 16:08    Medications: I have reviewed the patient's current medications.  Assessment:   1.  Crohn's exacerbation.  He is doing well on steroids and antibiotics and tolerating liquids.   Plan: 1.  We will change his diet to full liquids.  We will change his medications to oral.  If he tolerates  that today would send him home on prednisone 20 mg twice daily, Cipro 500 twice daily, Flagyl 500 3 times daily and full liquid diet. 2.  Would have him call Dr. Marge Duncans office and make an  appointment for follow-up for 1 week. The infliximab level and antibody level should be back by then and Dr. Bosie Clos to make a decision about whether to continue infliximab or change to another biologic   Tresea Mall 08/11/2017, 6:39 AM  This note was created using voice recognition software. Minor errors may Have occurred unintentionally.  Pager: [phone] If no answer or after hours call [phone]

## 2017-08-11 NOTE — Progress Notes (Signed)
PROGRESS NOTE    Tom Ferguson  AJG:[id number] DOB: 1990/05/08 DOA: 08/09/2017 PCP: Scot Jun, FNP   Brief Narrative: Patient is a 27 year old male with past medical history of Crohn's disease status post colostomy, chronic anemia who presented to the emergency department with nausea , vomiting and abdominal pain.  He was complaining of ostomy output to be watery.  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 He was initially started on IV steroids and IV antibiotics which have been changed to oral. Patient is currently on full liquid diet which will be gradually advanced.  Currently he does not complain of abdominal pain.  Nausea and vomiting have resolved.  Plan is to discharge him tomorrow.  Assessment & Plan:   Principal Problem:   Abdominal pain Active Problems:   Crohn disease ([place])   Leukocytosis   Partial small bowel obstruction ([facility])   Ileitis  Abdominal pain: Currently resolved.  Secondary from Crohn's disease exacerbation.  Crohn's disease exacerbation: Initially started on IV steroids and antibiotics.  Steroids and antibiotics have been changed to oral.  GI was following.  He will follow-up with gastroenterology as an outpatient.  On Infliximab at home.  Anemia/leukocytosis:Hb stable.  Leukocytosis resolved.  Nausea/vomiting: Significantly improved.  Continue Zofran.  Partial SBO: As per the CT imaging.  Currently looks resolved.  Patient is having output from his colostomy.  Currently on full liquid diet.   DVT prophylaxis: Lovenox Code Status: SCD Family Communication: None present at the bedside Disposition Plan: Home tomorrow   Consultants: Gastroenterology  Procedures: None  Antimicrobials: Cipro and  Flagyl  Subjective:  Patient seen and examined the bedside this morning.  Remains comfortable.  Denies any nausea, vomiting or abdominal pain.  Tolerating clear liquid diet. Objective: Vitals:   08/10/17 1321 08/10/17 2049 08/11/17 0457 08/11/17 0500  BP: 109/67 118/62 108/69   Pulse: 76 (!) 55 (!) 52   Resp: 18 16 16    Temp: 98.7 F (37.1 C) 98.3 F (36.8 C) 97.7 F (36.5 C)   TempSrc: Oral Oral Oral   SpO2: 100% 96% 98%   Weight:    76.4 kg (168 lb 8 oz)  Height:        Intake/Output Summary (Last 24 hours) at 08/11/2017 1017 Last data filed at 08/11/2017 0600 Gross per 24 hour  Intake 450 ml  Output 1100 ml  Net -650 ml   Filed Weights   08/09/17 1802 08/10/17 0444 08/11/17 0500  Weight: 75.9 kg (167 lb 5.3 oz) 76.7 kg (169 lb 3.2 oz) 76.4 kg (168 lb 8 oz)    Examination:  General exam: Appears calm and comfortable ,Not in distress,average built HEENT:PERRL,Oral mucosa moist, Ear/Nose normal on gross exam Respiratory system: Bilateral equal air entry, normal vesicular breath sounds, no wheezes or crackles  Cardiovascular system: S1 & S2 heard, RRR. No JVD, murmurs, rubs, gallops or clicks. No pedal edema. Gastrointestinal system: Abdomen is nondistended, soft and nontender. No organomegaly or masses felt. Normal bowel sounds heard.Colostomy Central nervous system: Alert and oriented. No focal neurological deficits. Extremities: No edema, no clubbing ,no cyanosis, distal peripheral pulses palpable. Skin: No rashes, lesions or ulcers,no icterus ,no pallor MSK: Normal muscle bulk,tone ,power Psychiatry: Judgement and insight appear normal. Mood & affect appropriate.     Data Reviewed: I have personally reviewed following  labs and imaging studies  CBC: Recent Labs  Lab 08/09/17 1407 08/09/17 1836 08/11/17 0524  WBC 26.3* 21.5* 10.3  HGB 11.1* 10.6* 9.4*  HCT 36.1* 35.0* 31.3*  MCV 69.4* 69.9* 70.0*  PLT 442* 455* 354*   Basic Metabolic Panel: Recent Labs   Lab 08/09/17 1407 08/10/17 0443 08/11/17 0524  NA 142 139 138  K 3.9 4.6 4.3  CL 102 106 104  CO2 27 26 25   GLUCOSE 108* 118* 117*  BUN 9 11 10   CREATININE 0.93 0.94 0.82  CALCIUM 9.9 9.1 9.3   GFR: Estimated Creatinine Clearance: 144.9 mL/min (by C-G formula based on SCr of 0.82 mg/dL). Liver Function Tests: Recent Labs  Lab 08/09/17 1407 08/10/17 0443 08/11/17 0524  AST 13* 9* 10*  ALT 11 9 10   ALKPHOS 52 47 37*  BILITOT 0.8 0.8 0.2*  PROT 8.3* 7.0 6.8  ALBUMIN 3.6 3.0* 2.9*   Recent Labs  Lab 08/09/17 1407  LIPASE 22   No results for input(s): AMMONIA in the last 168 hours. Coagulation Profile: No results for input(s): INR, PROTIME in the last 168 hours. Cardiac Enzymes: No results for input(s): CKTOTAL, CKMB, CKMBINDEX, TROPONINI in the last 168 hours. BNP (last 3 results) No results for input(s): PROBNP in the last 8760 hours. HbA1C: No results for input(s): HGBA1C in the last 72 hours. CBG: No results for input(s): GLUCAP in the last 168 hours. Lipid Profile: No results for input(s): CHOL, HDL, LDLCALC, TRIG, CHOLHDL, LDLDIRECT in the last 72 hours. Thyroid Function Tests: No results for input(s): TSH, T4TOTAL, FREET4, T3FREE, THYROIDAB in the last 72 hours. Anemia Panel: No results for input(s): VITAMINB12, FOLATE, FERRITIN, TIBC, IRON, RETICCTPCT in the last 72 hours. Sepsis Labs: Recent Labs  Lab 08/09/17 1453  LATICACIDVEN 0.9  1.1    Recent Results (from the past 240 hour(s))  Blood culture (routine x 2)     Status: None (Preliminary result)   Collection Time: 08/09/17  2:53 PM  Result Value Ref Range Status   Specimen Description   Final    BLOOD LEFT HAND Performed at [facility] [address] [place], [place] [address]    Special Requests   Final    BOTTLES DRAWN AEROBIC AND ANAEROBIC Blood Culture adequate volume Performed at [facility] [address] [place], [place] [address]     Culture   Final    NO GROWTH < 24 HOURS Performed at [facility] [address], [place], [place] [address]    Report Status PENDING  Incomplete  Blood culture (routine x 2)     Status: None (Preliminary result)   Collection Time: 08/09/17  2:53 PM  Result Value Ref Range Status   Specimen Description   Final    BLOOD BLOOD RIGHT FOREARM Performed at [facility] [address] [place], [place] [address]    Special Requests   Final    BOTTLES DRAWN AEROBIC AND ANAEROBIC Blood Culture adequate volume Performed at [facility] [address], [place], [place] [address]    Culture   Final    NO GROWTH < 24 HOURS Performed at [facility] [address], [place],  [address]    Report Status PENDING  Incomplete         Radiology Studies: Ct Abdomen Pelvis W Contrast  Result Date: 08/09/2017 CLINICAL DATA:  History of Crohn's. Left lower quadrant stoma. Abdominal pain EXAM: CT ABDOMEN  AND PELVIS WITH CONTRAST TECHNIQUE: Multidetector CT imaging of the abdomen and pelvis was performed using the standard protocol following bolus administration of intravenous contrast. CONTRAST:  172m ISOVUE-300 IOPAMIDOL (ISOVUE-300) INJECTION 61% COMPARISON:  05/02/2017 FINDINGS: Lower chest:  No contributory findings. Hepatobiliary: No focal liver abnormality.No evidence of biliary obstruction or stone. Pancreas: Unremarkable. Spleen: Unremarkable. Adrenals/Urinary Tract: Negative adrenals. No hydronephrosis or stone. Unremarkable bladder. Stomach/Bowel: History of Crohn's with descending colostomy and Hartman's pouch. Beginning at the mid transverse colon the distal colon is markedly thickened and hyperenhancing with indistinct wall. There are bands of soft tissue compatible with fistulae extending into the left upper quadrant soft tissues with tiny bubbles of gas in the left subhepatic space. 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 Proximal to the thickened segment the proximal colon and small bowel is fluid-filled. Mild wall thickening at the terminal ileum without penetrating disease at this level. No free perforation is noted. Vascular/Lymphatic: No acute vascular abnormality. No mass or adenopathy. Reproductive:Negative Other: No ascites or pneumoperitoneum. Musculoskeletal: Negative.  No sacroiliitis or spondylitis noted. : 1. History of Crohn's with persistent marked distal transverse and proximal descending colitis. 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 2. Partial bowel obstruction above the inflamed segment. 3. Mild terminal ileitis, new from prior. Electronically Signed   By: JMonte FantasiaM.D.   On: 08/09/2017 16:08        Scheduled Meds: . ciprofloxacin  500 mg Oral BID  . enoxaparin (LOVENOX) injection  40 mg Subcutaneous Q24H  . metroNIDAZOLE  500 mg Oral Q8H  . predniSONE  20 mg Oral BID WC   Continuous Infusions:   LOS: 2 days    Time spent: More than 50% of that time was spent in counseling and/or coordination of care.      AShelly Coss MD [facility] Pager [phone] If 7PM-7AM, please contact night-coverage [email or web address] Password [email or web address] 08/11/2017, 10:17 AM

## 2017-08-12 MED ORDER — PREDNISONE 20 MG PO TABS: 20.0000 mg | ORAL_TABLET | Freq: Two times a day (BID) | ORAL | 0 refills | Status: AC

## 2017-08-12 MED ORDER — CIPROFLOXACIN HCL 500 MG PO TABS: 500.0000 mg | ORAL_TABLET | Freq: Two times a day (BID) | ORAL | 0 refills | Status: AC

## 2017-08-12 MED ORDER — METRONIDAZOLE 500 MG PO TABS: 500.0000 mg | ORAL_TABLET | Freq: Three times a day (TID) | ORAL | 0 refills | Status: AC

## 2017-08-12 NOTE — Progress Notes (Signed)
[facility] PROGRESS NOTE Subjective Patient tolerated full liquid diet without problems tolerating oral medications  Objective: Vital signs in last 24 hours: Temp:  [97.5 F (36.4 C)-97.9 F (36.6 C)] 97.6 F (36.4 C) (07/31 0531) Pulse Rate:  [51-62] 51 (07/31 0531) Resp:  [16] 16 (07/31 0531) BP: (101-126)/(59-82) 105/68 (07/31 0531) SpO2:  [96 %-100 %] 96 % (07/31 0531) Weight:  [75.4 kg (166 lb 3.2 oz)] 75.4 kg (166 lb 3.2 oz) (07/31 0531) Last BM Date: 08/11/17  Intake/Output from previous day: 07/30 0701 - 07/31 0700 In: 120 [P.O.:120] Out: 1300 [Urine:1100; Stool:200] Intake/Output this shift: No intake/output data recorded.  PE:  Abdomen--soft and nontender  Lab Results: Recent Labs    08/09/17 1407 08/09/17 1836 08/11/17 0524  WBC 26.3* 21.5* 10.3  HGB 11.1* 10.6* 9.4*  HCT 36.1* 35.0* 31.3*  PLT 442* 455* 408*   BMET Recent Labs    08/09/17 1407 08/10/17 0443 08/11/17 0524  NA 142 139 138  K 3.9 4.6 4.3  CL 102 106 104  CO2 27 26 25   CREATININE 0.93 0.94 0.82   LFT Recent Labs    08/09/17 1407 08/10/17 0443 08/11/17 0524  PROT 8.3* 7.0 6.8  AST 13* 9* 10*  ALT 11 9 10   ALKPHOS 52 47 37*  BILITOT 0.8 0.8 0.2*   PT/INR No results for input(s): LABPROT, INR in the last 72 hours. PANCREAS Recent Labs    08/09/17 1407  LIPASE 22         Studies/Results: No results found.  Medications: I have reviewed the patient's current medications.  Assessment:   1.  Crohn's disease with exacerbation.  Patient seems to be doing fine on oral medications.  Has been on Humira which has been working until recently.  We did draw antibody and Humira level yesterday.  Patient actually has an appointment scheduled with Dr. Bosie Clos in 2 days.   Plan: Have encouraged him to keep the appointment with Dr. Bosie Clos.  Please see my office note from yesterday.  He can be discharged on these oral medications and keep that appointment with Dr.  Bosie Clos.  We would recommend a soft low residue diet.   Tresea Mall 08/12/2017, 7:40 AM  This note was created using voice recognition software. Minor errors may Have occurred unintentionally.  Pager: [phone] If no answer or after hours call [phone]

## 2017-08-12 NOTE — Discharge Summary (Signed)
Physician Discharge Summary  Tom Ferguson UXL:[id number] DOB: [birth year] DOA: 08/09/2017  PCP: Scot Jun, FNP  Admit date: 08/09/2017 Discharge date: 08/12/2017  Admitted From: Home Disposition:  Home  Discharge Condition:Stable CODE STATUS:FULL Diet recommendation: Regular  Brief/Interim Summary:  Patient is a 27 year old male with past medical history of Crohn's disease status post colostomy, chronic anemia who presented to the emergency department with nausea , vomiting and abdominal pain.  He was complaining of ostomy output to be watery.  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 He was initially started on IV steroids and IV antibiotics which have been changed to oral. Patient is currently on full liquid diet which has been advanced to soft.  Currently he does not complain of abdominal pain.  Nausea and vomiting have resolved.  Plan is to discharge him today. He will follow-up with gastroenterology as an outpatient in a week.    Following problems were addressed during his hospitalization:  Abdominal pain: Currently resolved.  Secondary from Crohn's disease exacerbation.  Crohn's disease exacerbation: Initially started on IV steroids and antibiotics.  Steroids and antibiotics have been changed to oral.  GI was following.  He will follow-up with gastroenterology as an outpatient.  On Infliximab at home.  Anemia/leukocytosis:Hb stable.  Leukocytosis resolved.  Nausea/vomiting: Significantly improved.    Partial SBO: As per the CT imaging.  Currently looks resolved.  Patient is having output from his colostomy.  Diet advanced.     Discharge Diagnoses:  Principal Problem:   Abdominal pain Active Problems:   Crohn  disease ([place])   Leukocytosis   Partial small bowel obstruction ([place])   Ileitis    Discharge Instructions  Discharge Instructions    Diet general   Complete by:  As directed    Soft,low residue diet for next 3-5 days   Discharge instructions   Complete by:  As directed    1) Please follow up with gastroenterology in a week. 2)Take prescribed medications as instructed. 3) Take soft low residue diet or next 3-5 days.   Increase activity slowly   Complete by:  As directed      Allergies as of 08/12/2017   No Known Allergies     Medication List    TAKE these medications   ciprofloxacin 500 MG tablet Commonly known as:  CIPRO Take 1 tablet (500 mg total) by mouth 2 (two) times daily for 4 days.   metroNIDAZOLE 500 MG tablet Commonly known as:  FLAGYL Take 1 tablet (500 mg total) by mouth every 8 (eight) hours for 4 days.   predniSONE 20 MG tablet Commonly known as:  DELTASONE Take 1 tablet (20 mg total) by mouth 2 (two) times daily with a meal for 7 days.   REMICADE IV Inject 1 each into the vein See admin instructions. Infusion every 8 weeks      Follow-up Information    Scot Jun, FNP. Schedule an appointment as soon as possible for a visit in 1 week(s).   Specialty:  Family Medicine Contact information: [place] [address] [phone]          No Known Allergies  Consultations:  GI   Procedures/Studies: Ct Abdomen Pelvis W Contrast  Result Date: 08/09/2017 CLINICAL DATA:  History of Crohn's. Left lower quadrant stoma. Abdominal pain  EXAM: CT ABDOMEN AND PELVIS WITH CONTRAST TECHNIQUE: Multidetector CT imaging of the abdomen and pelvis was performed using the standard protocol following bolus administration of intravenous contrast. CONTRAST:  155m ISOVUE-300 IOPAMIDOL (ISOVUE-300) INJECTION 61% COMPARISON:  05/02/2017 FINDINGS: Lower chest:  No contributory findings. Hepatobiliary: No focal liver abnormality.No evidence of  biliary obstruction or stone. Pancreas: Unremarkable. Spleen: Unremarkable. Adrenals/Urinary Tract: Negative adrenals. No hydronephrosis or stone. Unremarkable bladder. Stomach/Bowel: History of Crohn's with descending colostomy and Hartman's pouch. Beginning at the mid transverse colon the distal colon is markedly thickened and hyperenhancing with indistinct wall. There are bands of soft tissue compatible with fistulae extending into the left upper quadrant soft tissues with tiny bubbles of gas in the left subhepatic space. 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 Proximal to the thickened segment the proximal colon and small bowel is fluid-filled. Mild wall thickening at the terminal ileum without penetrating disease at this level. No free perforation is noted. Vascular/Lymphatic: No acute vascular abnormality. No mass or adenopathy. Reproductive:Negative Other: No ascites or pneumoperitoneum. Musculoskeletal: Negative.  No sacroiliitis or spondylitis noted. : 1. History of Crohn's with persistent marked distal transverse and proximal descending colitis. 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 2. Partial bowel obstruction above the inflamed segment. 3. Mild terminal ileitis, new from prior. Electronically Signed   By: JMonte FantasiaM.D.   On: 08/09/2017 16:08       Subjective: Patient seen and examined the bedside this morning.  Remains comfortable.  No new issues/events.  Stable for discharge home today.  Discharge Exam: Vitals:   08/11/17 2036 08/12/17 0531  BP: 126/82 105/68  Pulse: (!) 53 (!) 51  Resp: 16 16  Temp: (!) 97.5 F (36.4 C) 97.6 F (36.4 C)  SpO2: 97% 96%   Vitals:   08/11/17 0500 08/11/17 1233 08/11/17 2036 08/12/17 0531  BP:  (!) 101/59 126/82 105/68  Pulse:  62 (!) 53 (!) 51  Resp:   16 16  Temp:  97.9 F (36.6 C)  (!) 97.5 F (36.4 C) 97.6 F (36.4 C)  TempSrc:  Oral Oral Oral  SpO2:  100% 97% 96%  Weight: 76.4 kg (168 lb 8 oz)   75.4 kg (166 lb 3.2 oz)  Height:        General: Pt is alert, awake, not in acute distress Cardiovascular: RRR, S1/S2 +, no rubs, no gallops Respiratory: CTA bilaterally, no wheezing, no rhonchi Abdominal: Soft, NT, ND, bowel sounds +,colostomy Extremities: no edema, no cyanosis    The results of significant diagnostics from this hospitalization (including imaging, microbiology, ancillary and laboratory) are listed below for reference.     Microbiology: Recent Results (from the past 240 hour(s))  Blood culture (routine x 2)     Status: None (Preliminary result)   Collection Time: 08/09/17  2:53 PM  Result Value Ref Range Status   Specimen Description   Final    BLOOD LEFT HAND [facility] [address], [place] [address]   Special Requests   Final    BOTTLES DRAWN AEROBIC AND ANAEROBIC Blood Culture adequate volume Performed at [address] [place] [address]   Culture   Final    NO GROWTH 2 DAYS Performed at [facility] [place] [address]   Report Status PENDING  Incomplete  Blood culture (routine x 2)     Status: None (Preliminary result)   Collection Time: 08/09/17  2:53 PM  Result Value Ref Range Status   Specimen Description   Final    BLOOD BLOOD RIGHT FOREARM Performed at [facility] [address], [place], [place] [address]    Special Requests   Final    BOTTLES DRAWN AEROBIC AND ANAEROBIC Blood Culture adequate volume Performed at [facility] [address], [place], [place] [address]    Culture   Final    NO GROWTH 2 DAYS Performed at [facility] [address] [place], [place] [address]    Report Status PENDING  Incomplete     Labs: BNP (last 3 results) No  results for input(s): BNP in the last 8760 hours. Basic Metabolic Panel: Recent Labs  Lab 08/09/17 1407 08/10/17 0443 08/11/17 0524  NA 142 139 138  K 3.9 4.6 4.3  CL 102 106 104  CO2 27 26 25   GLUCOSE 108* 118* 117*  BUN 9 11 10   CREATININE 0.93 0.94 0.82  CALCIUM 9.9 9.1 9.3   Liver Function Tests: Recent Labs  Lab 08/09/17 1407 08/10/17 0443 08/11/17 0524  AST 13* 9* 10*  ALT 11 9 10   ALKPHOS 52 47 37*  BILITOT 0.8 0.8 0.2*  PROT 8.3* 7.0 6.8  ALBUMIN 3.6 3.0* 2.9*   Recent Labs  Lab 08/09/17 1407  LIPASE 22   No results for input(s): AMMONIA in the last 168 hours. CBC: Recent Labs  Lab 08/09/17 1407 08/09/17 1836 08/11/17 0524  WBC 26.3* 21.5* 10.3  HGB 11.1* 10.6* 9.4*  HCT 36.1* 35.0* 31.3*  MCV 69.4* 69.9* 70.0*  PLT 442* 455* 408*   Cardiac Enzymes: No results for input(s): CKTOTAL, CKMB, CKMBINDEX, TROPONINI in the last 168 hours. BNP: Invalid input(s): POCBNP CBG: No results for input(s): GLUCAP in the last 168 hours. D-Dimer No results for input(s): DDIMER in the last 72 hours. Hgb A1c No results for input(s): HGBA1C in the last 72 hours. Lipid Profile No results for input(s): CHOL, HDL, LDLCALC, TRIG, CHOLHDL, LDLDIRECT in the last 72 hours. Thyroid function studies No results for input(s): TSH, T4TOTAL, T3FREE, THYROIDAB in the last 72 hours.  Invalid input(s): FREET3 Anemia work up No results for input(s): VITAMINB12, FOLATE, FERRITIN, TIBC, IRON, RETICCTPCT in the last 72 hours. Urinalysis    Component Value Date/Time   COLORURINE YELLOW 08/09/2017 1817   APPEARANCEUR CLEAR 08/09/2017 1817   LABSPEC >1.046 (H) 08/09/2017 1817   PHURINE 6.0 08/09/2017 1817   GLUCOSEU NEGATIVE 08/09/2017 1817   HGBUR NEGATIVE 08/09/2017 1817   BILIRUBINUR NEGATIVE 08/09/2017 1817   KETONESUR 80 (A) 08/09/2017 1817   PROTEINUR NEGATIVE 08/09/2017 1817   NITRITE NEGATIVE 08/09/2017 1817   LEUKOCYTESUR NEGATIVE 08/09/2017 1817   Sepsis  Labs Invalid input(s): PROCALCITONIN,  WBC,  LACTICIDVEN Microbiology Recent Results (from the past 240 hour(s))  Blood culture (routine x 2)     Status: None (Preliminary result)   Collection Time: 08/09/17  2:53 PM  Result Value Ref Range Status   Specimen Description   Final    BLOOD LEFT HAND Performed at [facility], [place] [address] [place], [place] [address]    Special Requests   Final    BOTTLES DRAWN AEROBIC AND ANAEROBIC Blood Culture adequate volume Performed at [facility] [address] [place], [place] [address]    Culture   Final  NO GROWTH 2 DAYS Performed at [facility], [place] [address] [place], [place] [address]    Report Status PENDING  Incomplete  Blood culture (routine x 2)     Status: None (Preliminary result)   Collection Time: 08/09/17  2:53 PM  Result Value Ref Range Status   Specimen Description   Final    BLOOD BLOOD RIGHT FOREARM Performed at [facility] [address] [place], [place] [address]    Special Requests   Final    BOTTLES DRAWN AEROBIC AND ANAEROBIC Blood Culture adequate volume Performed at [facility] [address], [place], [place] [address]    Culture   Final    NO GROWTH 2 DAYS Performed at [facility] [address] [place], [place] [address]    Report Status PENDING  Incomplete    Please note: You were cared for by a hospitalist during your hospital stay. Once you are discharged, your primary care physician will handle any further medical issues. Please note that NO REFILLS for any discharge medications will be authorized once you are discharged, as it is imperative that you return to your primary care physician (or establish a relationship with a primary care physician if you do not have one) for your post hospital discharge needs so that they can reassess your need for medications and monitor your lab values.    Time  coordinating discharge: 40 minutes  SIGNED:   Shelly Coss, MD  [facility] 08/12/2017, 11:09 AM Pager [phone]  If 7PM-7AM, please contact night-coverage [email or web address] Password [email or web address]

## 2017-08-12 NOTE — Progress Notes (Signed)
Pt to be discharged to home this afternoon. Pt given discharge instructions including medications and schedules. Pt verbalized understanding of all discharge teaching. Pt discharged with discharge packet and prescriptions.

## 2017-08-14 LAB — CULTURE, BLOOD (ROUTINE X 2)
CULTURE: NO GROWTH
Culture: NO GROWTH
SPECIAL REQUESTS: ADEQUATE
Special Requests: ADEQUATE

## 2017-08-15 LAB — INFLIXIMAB (IFX) CONC+ IFX AB: Infliximab Drug Level: 5.7 ug/mL

## 2017-10-07 ENCOUNTER — Encounter (HOSPITAL_COMMUNITY): Payer: Self-pay

## 2017-10-07 ENCOUNTER — Other Ambulatory Visit: Payer: Self-pay

## 2017-10-07 DIAGNOSIS — K224 Dyskinesia of esophagus: Secondary | ICD-10-CM | POA: Diagnosis not present

## 2017-10-07 DIAGNOSIS — R1110 Vomiting, unspecified: Secondary | ICD-10-CM | POA: Diagnosis present

## 2017-10-07 LAB — COMPREHENSIVE METABOLIC PANEL
ALBUMIN: 3.4 g/dL — AB (ref 3.5–5.0)
ALT: 24 U/L (ref 0–44)
ANION GAP: 9 (ref 5–15)
AST: 22 U/L (ref 15–41)
Alkaline Phosphatase: 38 U/L (ref 38–126)
BUN: 15 mg/dL (ref 6–20)
CALCIUM: 9.2 mg/dL (ref 8.9–10.3)
CHLORIDE: 104 mmol/L (ref 98–111)
CO2: 28 mmol/L (ref 22–32)
Creatinine, Ser: 1.03 mg/dL (ref 0.61–1.24)
GFR calc Af Amer: >60 mL/min (ref >60)
GFR calc non Af Amer: >60 mL/min (ref >60)
GLUCOSE: 97 mg/dL (ref 70–99)
POTASSIUM: 3.7 mmol/L (ref 3.5–5.1)
Sodium: 141 mmol/L (ref 135–145)
TOTAL PROTEIN: 7.3 g/dL (ref 6.5–8.1)
Total Bilirubin: 0.4 mg/dL (ref 0.3–1.2)

## 2017-10-07 LAB — CBC
HCT: 37 % — ABNORMAL LOW (ref 39.0–52.0)
HEMOGLOBIN: 10.9 g/dL — AB (ref 13.0–17.0)
MCH: 20.3 pg — AB (ref 26.0–34.0)
MCHC: 29.5 g/dL — AB (ref 30.0–36.0)
MCV: 68.8 fL — ABNORMAL LOW (ref 78.0–100.0)
Platelets: 267 10*3/uL (ref 150–400)
RBC: 5.38 MIL/uL (ref 4.22–5.81)
RDW: 18.6 % — AB (ref 11.5–15.5)
WBC: 15.2 10*3/uL — ABNORMAL HIGH (ref 4.0–10.5)

## 2017-10-07 LAB — LIPASE, BLOOD: LIPASE: 31 U/L (ref 11–51)

## 2017-10-07 NOTE — ED Triage Notes (Signed)
Pt reports that he has had bad hiccups x 3 hours with forceful vomiting and reports that emesis looks like coffee grounds pt states it taste like blo. Pt denies abdominal pain and diarrhea at this time.

## 2017-10-08 ENCOUNTER — Emergency Department (HOSPITAL_COMMUNITY)
Admission: EM | Admit: 2017-10-08 | Discharge: 2017-10-08 | Disposition: A | Discharge status: Home / Self Care | Payer: BLUE CROSS/BLUE SHIELD | Attending: Emergency Medicine | Admitting: Emergency Medicine

## 2017-10-08 DIAGNOSIS — K224 Dyskinesia of esophagus: Secondary | ICD-10-CM

## 2017-10-08 MED ORDER — METOCLOPRAMIDE HCL 10 MG PO TABS: 10.0000 mg | ORAL_TABLET | Freq: Four times a day (QID) | ORAL | 0 refills | Status: DC

## 2017-10-08 MED ORDER — GI COCKTAIL ~~LOC~~
30.0000 mL | Freq: Once | ORAL | Status: AC
  Administered 2017-10-08: 30 mL via ORAL
  Filled 2017-10-08: qty 30

## 2017-10-08 MED ORDER — DICYCLOMINE HCL 20 MG PO TABS: 20.0000 mg | ORAL_TABLET | Freq: Two times a day (BID) | ORAL | 0 refills | Status: DC

## 2017-10-08 MED ORDER — DICYCLOMINE HCL 10 MG/ML IM SOLN
20.0000 mg | Freq: Once | INTRAMUSCULAR | Status: AC
  Administered 2017-10-08: 20 mg via INTRAMUSCULAR
  Filled 2017-10-08: qty 2

## 2017-10-08 NOTE — ED Notes (Signed)
Discharge instructions reviewed with patient. Patient verbalizes understanding. VSS.   

## 2017-10-08 NOTE — ED Provider Notes (Signed)
[facility]-EMERGENCY DEPT Provider Note   CSN: [id number] Arrival date & time: 10/07/17  2158     History   Chief Complaint Chief Complaint  Patient presents with  . Emesis    HPI Tom Ferguson is a 27 y.o. male a history of Crohn's disease, partial SBO who presents to the emergency department with a chief complaint of vomiting.  The patient reports that he was smoking marijuana when he suddenly developed hiccups at approximately 7 PM.  He states that the hiccups were very repetitive to the point where he felt like he was hyperventilating.  He reports that the episode concluded with an episode of dark brown emesis.  No history of similar.  He reports that he had 2-3 episodes over several hours.  Reports that he has had some epigastric abdominal pain that is persisted since last night.  He characterizes the pain as burning.  States that it feels similar to GERD and he has had increased belching.  He reports that he googled his symptoms and was concerned that he was vomiting blood.  He denies fever, chills, increased ostomy output, constipation, dysuria, hematuria, chest pain, or dyspnea.  Last episode of symptoms was prior to arrival.  Reports that he will typically smoke marijuana once per week.  He denies NSAID use.  He reports that he will drink approximately 1 alcoholic beverage on a monthly basis.  He is previously had an endoscopy, but reports it was several years ago.  The patient reports that he was recently started on Humira for Crohn's disease.  He was previously taking Remicade.  He has been taking prednisone for an extended period of time that was prescribed by his gastroenterologist since he has been changing medications.  The history is provided by the patient. No language interpreter was used.    Past Medical History:  Diagnosis Date  . Crohn's disease (HCC)   . DVT (deep venous thrombosis) [facility])     Patient Active Problem List   Diagnosis Date  Noted  . Abdominal pain 08/09/2017  . Partial small bowel obstruction (HCC) 08/09/2017  . Ileitis   . Protein-calorie malnutrition, severe 05/05/2017  . Exacerbation of Crohn's disease (HCC) 05/02/2017  . Crohn's colitis, with abscess (HCC)   . Leukocytosis   . Effusion of right knee 10/30/2016  . Crohn disease (HCC) 09/30/2016    Past Surgical History:  Procedure Laterality Date  . COLON SURGERY     colostomy  . COLOSTOMY          Home Medications    Prior to Admission medications   Medication Sig Start Date End Date Taking? Authorizing Provider  predniSONE (DELTASONE) 10 MG tablet Take 30 mg by mouth daily with breakfast.   Yes [provider]  dicyclomine (BENTYL) 20 MG tablet Take 1 tablet (20 mg total) by mouth 2 (two) times daily. 10/08/17   Bunny Lowdermilk A, PA-C  InFLIXimab (REMICADE IV) Inject 1 each into the vein See admin instructions. Infusion every 8 weeks    [provider]  metoCLOPramide (REGLAN) 10 MG tablet Take 1 tablet (10 mg total) by mouth every 6 (six) hours. 10/08/17   Aivy Akter A, PA-C    Family History Family History  Problem Relation Age of Onset  . Hypertension Mother   . Non-Hodgkin's lymphoma Father     Social History Social History   Tobacco Use  . Smoking status: Current Some Day Smoker  . Smokeless tobacco: Never Used  Substance Use  Topics  . Alcohol use: Yes    Comment: occ  . Drug use: No     Allergies   Patient has no known allergies.   Review of Systems Review of Systems  Constitutional: Negative for appetite change, chills and fever.  Respiratory: Negative for shortness of breath.   Cardiovascular: Negative for chest pain.  Gastrointestinal: Positive for abdominal pain, nausea and vomiting. Negative for blood in stool, constipation, diarrhea and rectal pain.  Genitourinary: Negative for dysuria, flank pain, hematuria, penile swelling and scrotal swelling.  Musculoskeletal: Negative for back pain.    Skin: Negative for rash.  Allergic/Immunologic: Negative for immunocompromised state.  Neurological: Negative for weakness, numbness and headaches.  Psychiatric/Behavioral: Negative for confusion.     Physical Exam Updated Vital Signs BP (!) 97/56   Pulse (!) 53   Temp (!) 97.3 F (36.3 C) (Oral)   Resp 16   Ht 6\' 2"  (1.88 m)   Wt 79.4 kg   SpO2 100%   BMI 22.47 kg/m   Physical Exam  Constitutional: He appears well-developed. No distress.  HENT:  Head: Normocephalic.  Eyes: Conjunctivae are normal.  Neck: Neck supple.  Cardiovascular: Normal rate, regular rhythm, normal heart sounds and intact distal pulses. Exam reveals no gallop and no friction rub.  No murmur heard. Pulmonary/Chest: Effort normal. No stridor. No respiratory distress. He has no wheezes. He has no rales. He exhibits no tenderness.  Abdominal: Soft. He exhibits no distension and no mass. There is no tenderness. There is no rebound and no guarding. No hernia.  Abdomen is soft, nontender, nondistended.  Ostomy bag is in place with soft brown stool.  No surrounding tenderness.  Well-healed surgical scar with no surrounding erythema, edema, or warmth.  Musculoskeletal: He exhibits no tenderness.  Neurological: He is alert.  Skin: Skin is warm and dry. He is not diaphoretic.  Psychiatric: His behavior is normal.  Nursing note and vitals reviewed.  ED Treatments / Results  Labs (all labs ordered are listed, but only abnormal results are displayed) Labs Reviewed  COMPREHENSIVE METABOLIC PANEL - Abnormal; Notable for the following components:      Result Value   Albumin 3.4 (*)    All other components within normal limits  CBC - Abnormal; Notable for the following components:   WBC 15.2 (*)    Hemoglobin 10.9 (*)    HCT 37.0 (*)    MCV 68.8 (*)    MCH 20.3 (*)    MCHC 29.5 (*)    RDW 18.6 (*)    All other components within normal limits  LIPASE, BLOOD  URINALYSIS, ROUTINE W REFLEX MICROSCOPIC     EKG None  Radiology No results found.  Procedures Procedures (including critical care time)  Medications Ordered in ED Medications  dicyclomine (BENTYL) injection 20 mg (20 mg Intramuscular Given 10/08/17 0531)  gi cocktail (Maalox,Lidocaine,Donnatal) (30 mLs Oral Given 10/08/17 0531)     Initial Impression / Assessment and Plan / ED Course  I have reviewed the triage vital signs and the nursing notes.  Pertinent labs & imaging results that were available during my care of the patient were reviewed by me and considered in my medical decision making (see chart for details).     27 year old male with a history of Crohn's disease and partial SBO presenting with 2-3 episodes of hiccups, vomiting, and epigastric pain.  Patient was discussed with Dr. Judd Lien, attending physician.  He states that the symptoms began while he was smoking marijuana, but  doubt hyperemesis cannabinoid syndrome.  Labs are notable for leukocytosis of 15, likely secondary to current corticosteroid use.   Hemoglobin 10.9, increased from previous.  Labs are otherwise reassuring.  On exam, abdomen is soft, nontender, nondistended.  Benign exam.  Suspect the patient's symptoms are secondary to esophageal spasm given history of present illness.  Spoke with pharmacy regarding antispasmodic in patients with Crohn's disease without active flares.  1 dose of Bentyl was given in the ED.  He was also given a GI cocktail.  He reports resolution of epigastric pain after GI cocktail.  He was also successfully fluid challenge without recurrent of symptoms in the ED.  He has a follow-up appointment with his gastroenterologist in the next 1 to 2 weeks.  Recommended discussing his symptoms at his next appointment.  Doubt SBO, Crohn's flare, GI bleed at this time.  Strict return precautions given.  He is hemodynamically stable and in no acute distress.  He is safe for discharge home with outpatient follow-up at this time.  Final Clinical  Impressions(s) / ED Diagnoses   Final diagnoses:  Esophageal spasm    ED Discharge Orders         Ordered    metoCLOPramide (REGLAN) 10 MG tablet  Every 6 hours     10/08/17 0720    dicyclomine (BENTYL) 20 MG tablet  2 times daily     10/08/17 0720           Kanon Novosel A, PA-C 10/08/17 0981    Geoffery Lyons, MD 10/09/17 [phone]

## 2017-10-08 NOTE — ED Triage Notes (Signed)
Called pt for re-check of V/S x1 No response 

## 2017-10-08 NOTE — Discharge Instructions (Addendum)
Thank you for allowing me to care for you today in the Emergency Department.   Please follow-up with your gastroenterologist.  If you develop nausea vomiting, you can take 1 tablet of Reglan every 6 hours.  If you have an episode of hiccups similar to the event yesterday, you can try taking 1 tablet of Bentyl.  Please use this medication sparingly and only if you cannot get symptoms to resolve otherwise.  Let your gastroenterologist know that you were seen for the symptoms today.  Return to the emergency department if you have bright red blood in your vomit, dark coffee-ground emesis, bright red blood or black stool in her ostomy bag, fever, chills, or other new, concerning symptoms.

## 2017-10-08 NOTE — ED Notes (Signed)
Pt tolerating fluids with no difficulty  

## 2017-12-05 ENCOUNTER — Other Ambulatory Visit: Payer: Self-pay

## 2017-12-05 ENCOUNTER — Encounter (HOSPITAL_COMMUNITY): Payer: Self-pay | Admitting: Emergency Medicine

## 2017-12-05 ENCOUNTER — Ambulatory Visit (HOSPITAL_COMMUNITY)
Admission: EM | Admit: 2017-12-05 | Discharge: 2017-12-05 | Disposition: A | Discharge status: Home / Self Care | Payer: BLUE CROSS/BLUE SHIELD | Attending: Family Medicine | Admitting: Family Medicine

## 2017-12-05 DIAGNOSIS — H5712 Ocular pain, left eye: Secondary | ICD-10-CM | POA: Diagnosis not present

## 2017-12-05 MED ORDER — FLUORESCEIN SODIUM 1 MG OP STRP
ORAL_STRIP | OPHTHALMIC | Status: AC
  Filled 2017-12-05: qty 1

## 2017-12-05 MED ORDER — MOXIFLOXACIN HCL 0.5 % OP SOLN: OPHTHALMIC | 0 refills | Status: DC

## 2017-12-05 MED ORDER — POLYETHYL GLYCOL-PROPYL GLYCOL 0.4-0.3 % OP GEL: 1.0000 "application " | Freq: Every evening | OPHTHALMIC | 0 refills | Status: DC | PRN

## 2017-12-05 NOTE — ED Triage Notes (Signed)
Left eye watery, swollen, irritated, onset one week ago.  Left eye is blurry, sensitive to light.  Denies injury.  Patient does not wear contacts.

## 2017-12-05 NOTE — ED Provider Notes (Signed)
[facility]    CSN: [id number] Arrival date & time: 12/05/17  1021     History   Chief Complaint Chief Complaint  Patient presents with  . Eye Problem    HPI Tom Ferguson is a 27 y.o. male.   27 year old male comes in for 1 week history of left eye irritation, photophobia, redness, tearing. No obvious injury/trauma. Blurry vision without diplopia. States has been using otc eye drops for eye redness with temporary relief of eye redness, but not the other symptoms. States tearing as decreased significantly. Photophobia, eye irritation has not worsened, but has not improved. Denies contact lens, glasses use. History of crohn's disease.      Past Medical History:  Diagnosis Date  . Crohn's disease (HCC)   . DVT (deep venous thrombosis) [facility])     Patient Active Problem List   Diagnosis Date Noted  . Abdominal pain 08/09/2017  . Partial small bowel obstruction (HCC) 08/09/2017  . Ileitis   . Protein-calorie malnutrition, severe 05/05/2017  . Exacerbation of Crohn's disease (HCC) 05/02/2017  . Crohn's colitis, with abscess (HCC)   . Leukocytosis   . Effusion of right knee 10/30/2016  . Crohn disease (HCC) 09/30/2016    Past Surgical History:  Procedure Laterality Date  . COLON SURGERY     colostomy  . COLOSTOMY         Home Medications    Prior to Admission medications   Medication Sig Start Date End Date Taking? Authorizing Provider  Adalimumab (HUMIRA [place]) Inject into the skin.   Yes [provider]  dicyclomine (BENTYL) 20 MG tablet Take 1 tablet (20 mg total) by mouth 2 (two) times daily. Patient not taking: Reported on 12/05/2017 10/08/17   Frederik Pear A, PA-C  moxifloxacin (VIGAMOX) 0.5 % ophthalmic solution Every 4 hours until seen by opthalmology 12/05/17   Belinda Fisher, PA-C  Polyethyl Glycol-Propyl Glycol (SYSTANE) 0.4-0.3 % GEL ophthalmic gel Place 1 application into both eyes at bedtime as needed. 12/05/17   Cathie Hoops, Quatavious Rossa V, PA-C    predniSONE (DELTASONE) 10 MG tablet Take 30 mg by mouth daily with breakfast.    [provider]    Family History Family History  Problem Relation Age of Onset  . Hypertension Mother   . Non-Hodgkin's lymphoma Father     Social History Social History   Tobacco Use  . Smoking status: Never Smoker  . Smokeless tobacco: Never Used  Substance Use Topics  . Alcohol use: Yes    Comment: occ  . Drug use: Yes    Types: Marijuana     Allergies   Patient has no known allergies.   Review of Systems Review of Systems  Reason unable to perform ROS: See HPI as above.     Physical Exam Triage Vital Signs ED Triage Vitals  Enc Vitals Group     BP 12/05/17 1143 118/66     Pulse Rate 12/05/17 1143 95     Resp 12/05/17 1143 16     Temp 12/05/17 1143 98.4 F (36.9 C)     Temp Source 12/05/17 1143 Oral     SpO2 12/05/17 1143 100 %     Weight --      Height --      Head Circumference --      Peak Flow --      Pain Score 12/05/17 1139 2     Pain Loc --      Pain Edu? --  Excl. in GC? --    No data found.  Updated Vital Signs BP 118/66 (BP Location: Left Arm)   Pulse 95   Temp 98.4 F (36.9 C) (Oral)   Resp 16   SpO2 100%   Visual Acuity Right Eye Distance: 20/15 Left Eye Distance: 20/100 (eye discharge making blurry) Bilateral Distance: 20/15  Right Eye Near:   Left Eye Near:    Bilateral Near:     Physical Exam  Constitutional: He is oriented to person, place, and time. He appears well-developed and well-nourished. No distress.  HENT:  Head: Normocephalic and atraumatic.  Eyes: Pupils are equal, round, and reactive to light. EOM and lids are normal. Lids are everted and swept, no foreign bodies found. Left eye exhibits no exudate and no hordeolum. No foreign body present in the left eye. Right conjunctiva is not injected. Left conjunctiva is injected.  Left eye photophobia with consensual photophobia to the right.  Fluorescein stain with  pinpoint uptake to the mid pupil.  IOP (tonopen): 33, 33 OD  36, 36 OS  Neck: Normal range of motion. Neck supple.  Neurological: He is alert and oriented to person, place, and time.  Skin: Skin is warm and dry.     UC Treatments / Results  Labs (all labs ordered are listed, but only abnormal results are displayed) Labs Reviewed - No data to display  EKG None  Radiology No results found.  Procedures Procedures (including critical care time)  Medications Ordered in UC Medications - No data to display  Initial Impression / Assessment and Plan / UC Course  I have reviewed the triage vital signs and the nursing notes.  Pertinent labs & imaging results that were available during my care of the patient were reviewed by me and considered in my medical decision making (see chart for details).    Discussed case with Dr Tracie Harrier. ?corneal ulcer vs healing abrasion given 1 week symptoms. Cannot rule out iritis/uveitis given photophobia and consensual photophobia. Will start vigamox and have patient follow up with ophthalmologist on Monday for further evaluation. Strict return precautions given. Patient expresses understanding and agrees to plan.  Case discussed with Dr Tracie Harrier, who agrees to plan.  Final Clinical Impressions(s) / UC Diagnoses   Final diagnoses:  Left eye pain    ED Prescriptions    Medication Sig Dispense Auth. Provider   moxifloxacin (VIGAMOX) 0.5 % ophthalmic solution Every 4 hours until seen by opthalmology 3 mL Tegh Franek V, PA-C   Polyethyl Glycol-Propyl Glycol (SYSTANE) 0.4-0.3 % GEL ophthalmic gel Place 1 application into both eyes at bedtime as needed. 1 Bottle Threasa Alpha, [place] 12/05/17 1428

## 2017-12-05 NOTE — Discharge Instructions (Addendum)
Please start moxifloxacin as directed. Artificial tear gel at night. Wait 10-15 minutes between drops, always use artificial tear gel last, as it prevents drops from penetrating through. I am worried for a corneal ulcer, or corneal abrasion that is healing. Please follow up with ophthalmology on Monday for further evaluation and management needed. If symptoms worsen, go to the emergency department for further evaluation.

## 2018-02-20 ENCOUNTER — Inpatient Hospital Stay (HOSPITAL_COMMUNITY)
Admission: EM | Admit: 2018-02-20 | Discharge: 2018-02-24 | DRG: 386 | Disposition: A | Discharge status: Home / Self Care | Payer: BLUE CROSS/BLUE SHIELD | Attending: Internal Medicine | Admitting: Internal Medicine

## 2018-02-20 ENCOUNTER — Other Ambulatory Visit: Payer: Self-pay

## 2018-02-20 ENCOUNTER — Encounter (HOSPITAL_COMMUNITY): Payer: Self-pay

## 2018-02-20 ENCOUNTER — Emergency Department (HOSPITAL_COMMUNITY): Payer: BLUE CROSS/BLUE SHIELD

## 2018-02-20 DIAGNOSIS — Z86718 Personal history of other venous thrombosis and embolism: Secondary | ICD-10-CM

## 2018-02-20 DIAGNOSIS — Z7952 Long term (current) use of systemic steroids: Secondary | ICD-10-CM

## 2018-02-20 DIAGNOSIS — Z933 Colostomy status: Secondary | ICD-10-CM

## 2018-02-20 DIAGNOSIS — K50114 Crohn's disease of large intestine with abscess: Secondary | ICD-10-CM | POA: Diagnosis present

## 2018-02-20 DIAGNOSIS — Z9049 Acquired absence of other specified parts of digestive tract: Secondary | ICD-10-CM | POA: Diagnosis not present

## 2018-02-20 DIAGNOSIS — E440 Moderate protein-calorie malnutrition: Secondary | ICD-10-CM

## 2018-02-20 DIAGNOSIS — K50113 Crohn's disease of large intestine with fistula: Secondary | ICD-10-CM

## 2018-02-20 DIAGNOSIS — D638 Anemia in other chronic diseases classified elsewhere: Secondary | ICD-10-CM | POA: Diagnosis present

## 2018-02-20 DIAGNOSIS — D72829 Elevated white blood cell count, unspecified: Secondary | ICD-10-CM | POA: Diagnosis present

## 2018-02-20 DIAGNOSIS — Z6823 Body mass index (BMI) 23.0-23.9, adult: Secondary | ICD-10-CM | POA: Diagnosis not present

## 2018-02-20 DIAGNOSIS — Z79899 Other long term (current) drug therapy: Secondary | ICD-10-CM

## 2018-02-20 DIAGNOSIS — R109 Unspecified abdominal pain: Secondary | ICD-10-CM | POA: Diagnosis present

## 2018-02-20 LAB — CBC
HCT: 34.4 % — ABNORMAL LOW (ref 39.0–52.0)
Hemoglobin: 8.9 g/dL — ABNORMAL LOW (ref 13.0–17.0)
MCH: 17.4 pg — ABNORMAL LOW (ref 26.0–34.0)
MCHC: 25.9 g/dL — ABNORMAL LOW (ref 30.0–36.0)
MCV: 67.2 fL — ABNORMAL LOW (ref 80.0–100.0)
NRBC: 0 % (ref 0.0–0.2)
PLATELETS: 378 10*3/uL (ref 150–400)
RBC: 5.12 MIL/uL (ref 4.22–5.81)
RDW: 23.2 % — AB (ref 11.5–15.5)
WBC: 19.8 10*3/uL — AB (ref 4.0–10.5)

## 2018-02-20 LAB — COMPREHENSIVE METABOLIC PANEL
ALT: 14 U/L (ref 0–44)
ANION GAP: 8 (ref 5–15)
AST: 12 U/L — ABNORMAL LOW (ref 15–41)
Albumin: 3.1 g/dL — ABNORMAL LOW (ref 3.5–5.0)
Alkaline Phosphatase: 42 U/L (ref 38–126)
BILIRUBIN TOTAL: 0.3 mg/dL (ref 0.3–1.2)
BUN: 12 mg/dL (ref 6–20)
CO2: 30 mmol/L (ref 22–32)
CREATININE: 0.91 mg/dL (ref 0.61–1.24)
Calcium: 9.2 mg/dL (ref 8.9–10.3)
Chloride: 102 mmol/L (ref 98–111)
Glucose, Bld: 100 mg/dL — ABNORMAL HIGH (ref 70–99)
Potassium: 3.5 mmol/L (ref 3.5–5.1)
SODIUM: 140 mmol/L (ref 135–145)
TOTAL PROTEIN: 7.3 g/dL (ref 6.5–8.1)

## 2018-02-20 LAB — URINALYSIS, ROUTINE W REFLEX MICROSCOPIC
Bilirubin Urine: NEGATIVE
Glucose, UA: NEGATIVE mg/dL
Hgb urine dipstick: NEGATIVE
KETONES UR: NEGATIVE mg/dL
LEUKOCYTES UA: NEGATIVE
Nitrite: NEGATIVE
PROTEIN: NEGATIVE mg/dL
Specific Gravity, Urine: 1.02 (ref 1.005–1.030)
pH: 5 (ref 5.0–8.0)

## 2018-02-20 LAB — LIPASE, BLOOD: Lipase: 27 U/L (ref 11–51)

## 2018-02-20 MED ORDER — SODIUM CHLORIDE 0.9 % IV BOLUS
1000.0000 mL | Freq: Once | INTRAVENOUS | Status: AC
  Administered 2018-02-20: 1000 mL via INTRAVENOUS

## 2018-02-20 MED ORDER — IBUPROFEN 800 MG PO TABS: 800.0000 mg | ORAL_TABLET | Freq: Three times a day (TID) | ORAL | Status: DC | PRN

## 2018-02-20 MED ORDER — HEPARIN SODIUM (PORCINE) 5000 UNIT/ML IJ SOLN
5000.0000 [IU] | Freq: Three times a day (TID) | INTRAMUSCULAR | Status: DC
  Administered 2018-02-20 – 2018-02-24 (×10): 5000 [IU] via SUBCUTANEOUS
  Filled 2018-02-20 (×10): qty 1

## 2018-02-20 MED ORDER — SODIUM CHLORIDE (PF) 0.9 % IJ SOLN
INTRAMUSCULAR | Status: AC
  Filled 2018-02-20: qty 50

## 2018-02-20 MED ORDER — SODIUM CHLORIDE 0.9 % IV SOLN
INTRAVENOUS | Status: DC
  Administered 2018-02-20: 23:00:00 via INTRAVENOUS

## 2018-02-20 MED ORDER — IOPAMIDOL (ISOVUE-300) INJECTION 61%
100.0000 mL | Freq: Once | INTRAVENOUS | Status: AC | PRN
  Administered 2018-02-20: 100 mL via INTRAVENOUS

## 2018-02-20 MED ORDER — ONDANSETRON HCL 4 MG PO TABS: 4.0000 mg | ORAL_TABLET | Freq: Four times a day (QID) | ORAL | Status: DC | PRN

## 2018-02-20 MED ORDER — KETOROLAC TROMETHAMINE 15 MG/ML IJ SOLN
15.0000 mg | Freq: Four times a day (QID) | INTRAMUSCULAR | Status: DC | PRN
  Administered 2018-02-20 – 2018-02-21 (×3): 15 mg via INTRAVENOUS
  Filled 2018-02-20 (×4): qty 1

## 2018-02-20 MED ORDER — SODIUM CHLORIDE 0.9 % IV SOLN
1.0000 g | Freq: Three times a day (TID) | INTRAVENOUS | Status: DC
  Administered 2018-02-20 – 2018-02-24 (×11): 1 g via INTRAVENOUS
  Filled 2018-02-20 (×12): qty 1

## 2018-02-20 MED ORDER — ONDANSETRON HCL 4 MG/2ML IJ SOLN: 4.0000 mg | Freq: Four times a day (QID) | INTRAMUSCULAR | Status: DC | PRN

## 2018-02-20 MED ORDER — IOPAMIDOL (ISOVUE-300) INJECTION 61%
INTRAVENOUS | Status: AC
  Filled 2018-02-20: qty 100

## 2018-02-20 MED ORDER — SODIUM CHLORIDE 0.9% FLUSH
3.0000 mL | Freq: Once | INTRAVENOUS | Status: AC
  Administered 2018-02-20: 3 mL via INTRAVENOUS

## 2018-02-20 MED ORDER — PREDNISONE 20 MG PO TABS: 40.0000 mg | ORAL_TABLET | Freq: Every day | ORAL | Status: DC

## 2018-02-20 MED ORDER — SODIUM CHLORIDE 0.9 % IV SOLN
INTRAVENOUS | Status: DC
  Administered 2018-02-20 – 2018-02-21 (×4): via INTRAVENOUS

## 2018-02-20 NOTE — Progress Notes (Signed)
Pharmacy Antibiotic Note  Tom Ferguson is a 28 y.o. male with abdominal pain admitted on 02/20/2018 with intra-abdominal infection.  Pharmacy has been consulted for meropenem dosing.  Plan: Meropenem 1 Gm IV q8h F/u scr/cultures  Height: 6\' 2"  (188 cm) Weight: 180 lb (81.6 kg) IBW/kg (Calculated) : 82.2  Temp (24hrs), Avg:98.7 F (37.1 C), Min:98.5 F (36.9 C), Max:99 F (37.2 C)  Recent Labs  Lab 02/20/18 1348  WBC 19.8*  CREATININE 0.91    Estimated Creatinine Clearance: 138.2 mL/min (by C-G formula based on SCr of 0.91 mg/dL).    No Known Allergies  Antimicrobials this admission: 2/8 meropenem >>    >>   Dose adjustments this admission:   Microbiology results:  BCx:   UCx:    Sputum:    MRSA PCR:  Thank you for allowing pharmacy to be a part of this patient's care.  Lorenza Evangelist 02/20/2018 10:33 PM

## 2018-02-20 NOTE — ED Notes (Signed)
WILL TRANSPORT PT TO 6E 1608-1. AAOX4. PT IN NO APPARENT DISTRESS OR PAIN. IVF INFUSING W/O PAIN OR SWELLING. THE OPPORTUNITY TO ASK QUESTIONS WAS PROVIDED.

## 2018-02-20 NOTE — H&P (Signed)
History and Physical   Tom Ferguson YBO:[id number] DOB: 23-Jul-1990 DOA: 02/20/2018  Referring MD/NP/PA: Dr Rogene Houston  PCP: Scot Jun, FNP   Outpatient Specialists: Dr Michail Sermon, GI   Patient coming from: Home  Chief Complaint: Abdominal pain  HPI: Tom Ferguson is a 28 y.o. male with medical history significant of complicated Crohn's disease status post colostomy in 2008 with recurrent flareups, history of protein calorie maturation, history of DVT, who is on chronic prednisone therapy for his Crohn's flareup.  Patient came to the ER due to significant abdominal pain rated at 8 out of 10.  Associated with discharge around his colostomy bag.  Denied any nausea vomiting or diarrhea.  Denied any hematemesis or melena.  Patient was seen and evaluated in the ER and found to have purulent discharge around his colostomy bag.  GI and surgery consulted and recommended hospitalist admission with their follow-up.  Patient had his first colostomy in 2008 with the plan to close it 6 months later.  He had flareups prior to the closure and since then he has not had it reversed.  He used to live in [place] where the colostomy was done but now lives in [place]..  ED Course: His temperature is 99 blood pressure 134/94 pulse 101 respirate of 16 oxygen sat 98% room air.  White count 20,000 hemoglobin 8.9 and platelets 378.  Glucose 100 otherwise the rest of the lab work appears to be within normal.  Urinalysis essentially negative.  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  The complex: Left upper quadrant fistula is currently involving the greater curvature of the body of the stomach as well as the proximal jejunum and the distal transverse colon.  Review of Systems: As per HPI  otherwise 10 point review of systems negative.    Past Medical History:  Diagnosis Date  . Crohn's disease ([place])   . DVT (deep venous thrombosis) ([place])     Past Surgical History:  Procedure Laterality Date  . COLON SURGERY     colostomy  . COLOSTOMY       reports that he has never smoked. He has never used smokeless tobacco. He reports current alcohol use. He reports current drug use. Drug: Marijuana.  No Known Allergies  Family History  Problem Relation Age of Onset  . Hypertension Mother   . Non-Hodgkin's lymphoma Father      Prior to Admission medications   Medication Sig Start Date End Date Taking? Authorizing Provider  Adalimumab (HUMIRA) 40 MG/0.4ML PSKT Inject 40 mg into the skin every 14 (fourteen) days.   Yes [provider]  ibuprofen (ADVIL,MOTRIN) 800 MG tablet Take 800 mg by mouth every 8 (eight) hours as needed for moderate pain.   Yes [provider]  predniSONE (DELTASONE) 10 MG tablet Take 40 mg by mouth daily with breakfast.    Yes [provider]  dicyclomine (BENTYL) 20 MG tablet Take 1 tablet (20 mg total) by mouth 2 (two) times daily. Patient not taking: Reported on 12/05/2017 10/08/17   Joline Maxcy A, PA-C  moxifloxacin (VIGAMOX) 0.5 % ophthalmic solution Every 4 hours until seen by opthalmology Patient not taking: Reported on 02/20/2018 12/05/17   Ok Edwards, PA-C  Polyethyl Glycol-Propyl Glycol (SYSTANE) 0.4-0.3 % GEL ophthalmic gel Place 1 application into both eyes at bedtime as  needed. Patient not taking: Reported on 02/20/2018 12/05/17   Arturo Morton    Physical Exam: Vitals:   02/20/18 1929 02/20/18 2145 02/20/18 2153 02/20/18 2216  BP: (!) 133/94 117/69  121/81  Pulse: 75 63  65  Resp: 16 16  14   Temp:   98.7 F (37.1 C) 98.5 F (36.9 C)  TempSrc:   Oral Oral  SpO2: 100% 100%  98%  Weight:      Height:          Constitutional: NAD, calm, comfortable Vitals:   02/20/18 1929 02/20/18 2145 02/20/18 2153  02/20/18 2216  BP: (!) 133/94 117/69  121/81  Pulse: 75 63  65  Resp: 16 16  14   Temp:   98.7 F (37.1 C) 98.5 F (36.9 C)  TempSrc:   Oral Oral  SpO2: 100% 100%  98%  Weight:      Height:       Eyes: PERRL, lids and conjunctivae normal ENMT: Mucous membranes are moist. Posterior pharynx clear of any exudate or lesions.Normal dentition.  Neck: normal, supple, no masses, no thyromegaly Respiratory: clear to auscultation bilaterally, no wheezing, no crackles. Normal respiratory effort. No accessory muscle use.  Cardiovascular: Regular rate and rhythm, no murmurs / rubs / gallops. No extremity edema. 2+ pedal pulses. No carotid bruits.  Abdomen: Presence of colostomy in the left abdominal wall, obvious midline scar with distention and outpouring of purulent material around his colostomy bag.  Colostomy bag has brown stool.  Tenderness with no organomegaly.  Positive bowel sounds. Musculoskeletal: no clubbing / cyanosis. No joint deformity upper and lower extremities. Good ROM, no contractures. Normal muscle tone.  Skin: no rashes, lesions, ulcers. No induration Neurologic: CN 2-12 grossly intact. Sensation intact, DTR normal. Strength 5/5 in all 4.  Psychiatric: Normal judgment and insight. Alert and oriented x 3. Normal mood.     Labs on Admission: I have personally reviewed following labs and imaging studies  CBC: Recent Labs  Lab 02/20/18 1348  WBC 19.8*  HGB 8.9*  HCT 34.4*  MCV 67.2*  PLT 974   Basic Metabolic Panel: Recent Labs  Lab 02/20/18 1348  NA 140  K 3.5  CL 102  CO2 30  GLUCOSE 100*  BUN 12  CREATININE 0.91  CALCIUM 9.2   GFR: Estimated Creatinine Clearance: 138.2 mL/min (by C-G formula based on SCr of 0.91 mg/dL). Liver Function Tests: Recent Labs  Lab 02/20/18 1348  AST 12*  ALT 14  ALKPHOS 42  BILITOT 0.3  PROT 7.3  ALBUMIN 3.1*   Recent Labs  Lab 02/20/18 1348  LIPASE 27   No results for input(s): AMMONIA in the last 168  hours. Coagulation Profile: No results for input(s): INR, PROTIME in the last 168 hours. Cardiac Enzymes: No results for input(s): CKTOTAL, CKMB, CKMBINDEX, TROPONINI in the last 168 hours. BNP (last 3 results) No results for input(s): PROBNP in the last 8760 hours. HbA1C: No results for input(s): HGBA1C in the last 72 hours. CBG: No results for input(s): GLUCAP in the last 168 hours. Lipid Profile: No results for input(s): CHOL, HDL, LDLCALC, TRIG, CHOLHDL, LDLDIRECT in the last 72 hours. Thyroid Function Tests: No results for input(s): TSH, T4TOTAL, FREET4, T3FREE, THYROIDAB in the last 72 hours. Anemia Panel: No results for input(s): VITAMINB12, FOLATE, FERRITIN, TIBC, IRON, RETICCTPCT in the last 72 hours. Urine analysis:    Component Value Date/Time   COLORURINE YELLOW 02/20/2018 [place] 02/20/2018 1348  LABSPEC 1.020 02/20/2018 1348   PHURINE 5.0 02/20/2018 1348   GLUCOSEU NEGATIVE 02/20/2018 1348   HGBUR NEGATIVE 02/20/2018 [place] 02/20/2018 [place] 02/20/2018 1348   PROTEINUR NEGATIVE 02/20/2018 1348   NITRITE NEGATIVE 02/20/2018 1348   LEUKOCYTESUR NEGATIVE 02/20/2018 1348   Sepsis Labs: @LABRCNTIP (procalcitonin:4,lacticidven:4) )No results found for this or any previous visit (from the past 240 hour(s)).   Radiological Exams on Admission: Ct Abdomen Pelvis W Contrast  Result Date: 02/20/2018 CLINICAL DATA:  Crohn disease status post partial colectomy with end colostomy. New swelling and foul-smelling drainage adjacent to the colostomy. EXAM: CT ABDOMEN AND PELVIS WITH CONTRAST TECHNIQUE: Multidetector CT imaging of the abdomen and pelvis was performed using the standard protocol following bolus administration of intravenous contrast. CONTRAST:  169m ISOVUE-300 IOPAMIDOL (ISOVUE-300) INJECTION 61% COMPARISON:  08/09/2017 CT abdomen/pelvis. FINDINGS: Lower chest: No significant pulmonary nodules or acute  consolidative airspace disease. Hepatobiliary: Normal liver size. No liver mass. Normal gallbladder with no radiopaque cholelithiasis. No biliary ductal dilatation. Pancreas: Normal, with no mass or duct dilation. Spleen: Normal size. No mass. Adrenals/Urinary Tract: Normal adrenals. Normal kidneys with no hydronephrosis and no renal mass. Normal bladder. Stomach/Bowel: Patient is status post partial distal colectomy with end colostomy in the ventral left abdominal wall. There is a complex chronic left upper quadrant fistula involving the greater curvature of the body of the stomach, the distal transverse colon, and the proximal jejunum. 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 No small bowel dilatation or new sites of small bowel wall thickening. Appendix not discretely visualized. Collapsed normal rectal remnant. Vascular/Lymphatic: Normal caliber abdominal aorta. Patent portal, splenic, hepatic and renal veins. No pathologically enlarged lymph nodes in the abdomen or pelvis. Reproductive: Top-normal size prostate. Other: No pneumoperitoneum.  No ascites. Musculoskeletal: No aggressive appearing focal osseous lesions. IMPRESSION: 1. 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 2. The complex chronic left upper quadrant fistula involves the greater curvature of the body of the stomach, the proximal jejunum and the distal transverse colon. 3. No evidence of bowel obstruction. Electronically Signed   By: JIlona SorrelM.D.   On:  02/20/2018 20:15      Assessment/Plan Principal Problem:   Crohn's colitis, with abscess ([place] Active Problems:   Leukocytosis   Abdominal pain   Anemia of chronic disease     #1 Crohn's disease with abscesses and fistulae: Patient is having complicated Crohn's disease flareup.  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  We will admit the patient to the hospital.  Start him on IV meropenem for intra-abdominal infections.  GI as well as surgery consulted.  Patient may require surgical intervention with less drainage by interventional radiology.  Will defer decision to both GI and surgery.  Obtain cultures.  Supportive care with IV fluids.  #2 leukocytosis: Secondary to infection.  Continue to monitor white count.  Once abscess is drained we will continue monitoring.  #3 anemia of chronic disease: H&H stable.  Most likely is due to Crohn's disease.  Monitor the H&H.  No  transfusion at this point.    DVT prophylaxis: Heparin Code Status: Full code Family Communication: No family at bedside Disposition Plan: To be determined Consults called: [facility] and general surgery Admission status: Inpatient  Severity of Illness: The appropriate patient status for this patient is INPATIENT. Inpatient status is judged to be reasonable and necessary in order to provide the required intensity of service to ensure the patient's safety. The patient's presenting symptoms, physical exam findings, and initial radiographic and laboratory data in the context of their chronic comorbidities is felt to place them at high risk for further clinical deterioration. Furthermore, it is not anticipated that the patient will be medically stable for discharge from the hospital within 2 midnights of admission. The following factors support the patient status of inpatient.   " The patient's presenting symptoms include abdominal pain with discharge. " The worrisome physical exam findings  include purulent discharge from the anterior abdominal wall. " The initial radiographic and laboratory data are worrisome because of CT scanning showing multiple fistula as well as abscesses. " The chronic co-morbidities include Crohn's disease.   * I certify that at the point of admission it is my clinical judgment that the patient will require inpatient hospital care spanning beyond 2 midnights from the point of admission due to high intensity of service, high risk for further deterioration and high frequency of surveillance required.Barbette Merino MD [facility] Pager [phone]  If 7PM-7AM, please contact night-coverage [email or web address] Password [facility]  02/20/2018, 10:24 PM

## 2018-02-20 NOTE — ED Notes (Signed)
Bed: WA03 Expected date:  Expected time:  Means of arrival:  Comments: Hold for triage  

## 2018-02-20 NOTE — ED Triage Notes (Signed)
Pt has chrons disease.  Pt wasn't feeling well last fall.  Placed on steroids.  Pt now with abdominal swelling near colostomy site.  Red and warm with "blister like" area noted.  No fever at home.  Pain increasing.

## 2018-02-20 NOTE — ED Provider Notes (Signed)
[facility] DEPT Provider Note   CSN: [id number] Arrival date & time: 02/20/18  1323     History   Chief Complaint Chief Complaint  Patient presents with  . Abdominal Pain    HPI Tom Ferguson is a 28 y.o. male.  Patient with longstanding history of Crohn's.  In 4259 he had complication of Crohn's with a bowel perforation and required a colostomy.  This was done down in [place].  Patient has not been able to have the colostomy taken down due to other problems with his Crohn's.  Currently followed by [facility] gastroenterology has been on steroids most of the fall and has been on steroids since the beginning of January.  Still taking about 40 mg of prednisone a day.  Patient has been feeling fine he noticed some kind of a swelling to the medial aspect of the colostomy.  And then while in the waiting room this ruptured and lots of pus came out.  Patient without fevers.  Without any significant abdominal pain.     Past Medical History:  Diagnosis Date  . Crohn's disease ([place])   . DVT (deep venous thrombosis) [facility])     Patient Active Problem List   Diagnosis Date Noted  . Abdominal pain 08/09/2017  . Partial small bowel obstruction ([place]) 08/09/2017  . Ileitis   . Protein-calorie malnutrition, severe 05/05/2017  . Exacerbation of Crohn's disease ([place]) 05/02/2017  . Crohn's colitis, with abscess ([place])   . Leukocytosis   . Effusion of right knee 10/30/2016  . Crohn disease ([place]) 09/30/2016    Past Surgical History:  Procedure Laterality Date  . COLON SURGERY     colostomy  . COLOSTOMY          Home Medications    Prior to Admission medications   Medication Sig Start Date End Date Taking? Authorizing Provider  Adalimumab (HUMIRA) 40 MG/0.4ML PSKT Inject 40 mg into the skin every 14 (fourteen) days.   Yes [provider]  ibuprofen (ADVIL,MOTRIN) 800 MG tablet Take 800 mg by mouth every 8 (eight) hours as needed for moderate pain.   Yes  [provider]  predniSONE (DELTASONE) 10 MG tablet Take 40 mg by mouth daily with breakfast.    Yes [provider]  dicyclomine (BENTYL) 20 MG tablet Take 1 tablet (20 mg total) by mouth 2 (two) times daily. Patient not taking: Reported on 12/05/2017 10/08/17   Joline Maxcy A, PA-C  moxifloxacin (VIGAMOX) 0.5 % ophthalmic solution Every 4 hours until seen by opthalmology Patient not taking: Reported on 02/20/2018 12/05/17   Ok Edwards, PA-C  Polyethyl Glycol-Propyl Glycol (SYSTANE) 0.4-0.3 % GEL ophthalmic gel Place 1 application into both eyes at bedtime as needed. Patient not taking: Reported on 02/20/2018 12/05/17   Arturo Morton    Family History Family History  Problem Relation Age of Onset  . Hypertension Mother   . Non-Hodgkin's lymphoma Father     Social History Social History   Tobacco Use  . Smoking status: Never Smoker  . Smokeless tobacco: Never Used  Substance Use Topics  . Alcohol use: Yes    Comment: occ  . Drug use: Yes    Types: Marijuana     Allergies   Patient has no known allergies.   Review of Systems Review of Systems  Constitutional: Negative for chills and fever.  HENT: Negative for rhinorrhea and sore throat.   Eyes: Negative for visual disturbance.  Respiratory: Negative for cough and  shortness of breath.   Cardiovascular: Negative for chest pain and leg swelling.  Gastrointestinal: Negative for abdominal pain, diarrhea, nausea and vomiting.  Genitourinary: Negative for dysuria.  Musculoskeletal: Negative for back pain and neck pain.  Skin: Negative for rash.  Neurological: Negative for dizziness, light-headedness and headaches.  Hematological: Does not bruise/bleed easily.  Psychiatric/Behavioral: Negative for confusion.     Physical Exam Updated Vital Signs BP (!) 133/94 (BP Location: Right Arm)   Pulse 75   Temp 99 F (37.2 C) (Oral)   Resp 16   Ht 1.88 m (6' 2" )   Wt 81.6 kg   SpO2 100%   BMI 23.11 kg/m    Physical Exam Vitals signs and nursing note reviewed.  Constitutional:      Appearance: He is well-developed.  HENT:     Head: Normocephalic and atraumatic.     Mouth/Throat:     Mouth: Mucous membranes are moist.  Eyes:     Extraocular Movements: Extraocular movements intact.     Conjunctiva/sclera: Conjunctivae normal.     Pupils: Pupils are equal, round, and reactive to light.  Neck:     Musculoskeletal: Normal range of motion and neck supple.  Cardiovascular:     Rate and Rhythm: Normal rate and regular rhythm.     Heart sounds: No murmur.  Pulmonary:     Effort: Pulmonary effort is normal. No respiratory distress.     Breath sounds: Normal breath sounds.  Abdominal:     General: Abdomen is flat.     Palpations: Abdomen is soft.     Tenderness: There is abdominal tenderness.     Comments: Ostomy left side of abdomen.  Large well-healed midline scar.  Just lateral to that and medial to the colostomy there is a large amount of purulent discharge suggestive of a subcutaneous abscess or may be fistula.  No stool or blood discharge.  Some mild tenderness around that area.  Otherwise abdomen without any significant guarding.  Musculoskeletal: Normal range of motion.  Skin:    General: Skin is warm and dry.     Capillary Refill: Capillary refill takes less than 2 seconds.  Neurological:     General: No focal deficit present.     Mental Status: He is alert and oriented to person, place, and time.      ED Treatments / Results  Labs (all labs ordered are listed, but only abnormal results are displayed) Labs Reviewed  COMPREHENSIVE METABOLIC PANEL - Abnormal; Notable for the following components:      Result Value   Glucose, Bld 100 (*)    Albumin 3.1 (*)    AST 12 (*)    All other components within normal limits  CBC - Abnormal; Notable for the following components:   WBC 19.8 (*)    Hemoglobin 8.9 (*)    HCT 34.4 (*)    MCV 67.2 (*)    MCH 17.4 (*)    MCHC 25.9 (*)     RDW 23.2 (*)    All other components within normal limits  LIPASE, BLOOD  URINALYSIS, ROUTINE W REFLEX MICROSCOPIC    EKG None  Radiology Ct Abdomen Pelvis W Contrast  Result Date: 02/20/2018 CLINICAL DATA:  Crohn disease status post partial colectomy with end colostomy. New swelling and foul-smelling drainage adjacent to the colostomy. EXAM: CT ABDOMEN AND PELVIS WITH CONTRAST TECHNIQUE: Multidetector CT imaging of the abdomen and pelvis was performed using the standard protocol following bolus administration of intravenous contrast. CONTRAST:  143m ISOVUE-300 IOPAMIDOL (ISOVUE-300) INJECTION 61% COMPARISON:  08/09/2017 CT abdomen/pelvis. FINDINGS: Lower chest: No significant pulmonary nodules or acute consolidative airspace disease. Hepatobiliary: Normal liver size. No liver mass. Normal gallbladder with no radiopaque cholelithiasis. No biliary ductal dilatation. Pancreas: Normal, with no mass or duct dilation. Spleen: Normal size. No mass. Adrenals/Urinary Tract: Normal adrenals. Normal kidneys with no hydronephrosis and no renal mass. Normal bladder. Stomach/Bowel: Patient is status post partial distal colectomy with end colostomy in the ventral left abdominal wall. There is a complex chronic left upper quadrant fistula involving the greater curvature of the body of the stomach, the distal transverse colon, and the proximal jejunum. 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 No small bowel dilatation or new sites of small bowel wall thickening. Appendix not discretely visualized. Collapsed normal rectal remnant. Vascular/Lymphatic: Normal caliber abdominal aorta. Patent portal, splenic, hepatic and renal veins. No  pathologically enlarged lymph nodes in the abdomen or pelvis. Reproductive: Top-normal size prostate. Other: No pneumoperitoneum.  No ascites. Musculoskeletal: No aggressive appearing focal osseous lesions. IMPRESSION: 1. 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 2. The complex chronic left upper quadrant fistula involves the greater curvature of the body of the stomach, the proximal jejunum and the distal transverse colon. 3. No evidence of bowel obstruction. Electronically Signed   By: JIlona SorrelM.D.   On: 02/20/2018 20:15    Procedures Procedures (including critical care time)  Medications Ordered in ED Medications  iopamidol (ISOVUE-300) 61 % injection (has no administration in time range)  sodium chloride (PF) 0.9 % injection (has no administration in time range)  sodium chloride flush (NS) 0.9 % injection 3 mL (3 mLs Intravenous Given 02/20/18 1940)  iopamidol (ISOVUE-300) 61 % injection 100 mL (100 mLs Intravenous Contrast Given 02/20/18 1949)     Initial Impression / Assessment and Plan / ED Course  I have reviewed the triage vital signs and the nursing notes.  Pertinent labs & imaging results that were available during my care of the patient were reviewed by me and considered in my medical decision making (see chart for details).    CT scan shows evidence of a complicated fistula with a subcutaneous abscess.  Discussed with Dr. NLucia Gaskinsfrom general surgery as well as [facility] coverage.  And Dr. GJeannie Fendwho will admit.  Nothing to do tonight other than start him on antibiotics for intra-abdominal infection.  Patient not toxic.  White count was 19,000 but otherwise not febrile no hypotension.  Not tachycardic.  Dr. NLucia Gaskinsstated that since is draining pus that that is  reassuring for now.  They will meet tomorrow work on a game plan for how to approach this.   Final Clinical Impressions(s) / ED Diagnoses   Final diagnoses:  Crohn's colitis, with fistula ([facility]    ED Discharge Orders    None       ZFredia Sorrow MD 02/20/18 2310

## 2018-02-20 NOTE — ED Notes (Signed)
ED TO INPATIENT HANDOFF REPORT  Name/Age/Gender Tom Ferguson 28 y.o. male  Code Status Code Status History    Date Active Date Inactive Code Status Order ID Comments User Context   08/09/2017 1738 08/12/2017 1549 Full Code [id number]  Pearson Grippe, MD ED      Home/SNF/Other Home  Chief Complaint Abdominal Pain  Level of Care/Admitting Diagnosis ED Disposition    ED Disposition Condition Comment   Admit  Hospital Area: [facility] [[id number]]  Level of Care: Med-Surg [16]  Diagnosis: Crohn's colitis, with abscess [facility]) [[id number]]  Admitting Physician: Rometta Emery [[id number]]  Attending Physician: Rometta Emery [[id number]]  Estimated length of stay: past midnight tomorrow  Certification:: I certify this patient will need inpatient services for at least 2 midnights  PT Class (Do Not Modify): Inpatient [101]  PT Acc Code (Do Not Modify): Private [1]       Medical History Past Medical History:  Diagnosis Date  . Crohn's disease (HCC)   . DVT (deep venous thrombosis) (HCC)     Allergies No Known Allergies  IV Location/Drains/Wounds Patient Lines/Drains/Airways Status   Active Line/Drains/Airways    Name:   Placement date:   Placement time:   Site:   Days:   Peripheral IV 02/20/18 Left;Lateral Antecubital   02/20/18    1939    Antecubital   less than 1   Colostomy LLQ   -    -    LLQ             Labs/Imaging Results for orders placed or performed during the hospital encounter of 02/20/18 (from the past 48 hour(s))  Lipase, blood     Status: None   Collection Time: 02/20/18  1:48 PM  Result Value Ref Range   Lipase 27 11 - 51 U/L    Comment: Performed at [facility], [address], [place], [place] [address]  Comprehensive metabolic panel     Status: Abnormal   Collection Time: 02/20/18  1:48 PM  Result Value Ref Range   Sodium 140 135 - 145 mmol/L   Potassium 3.5 3.5 - 5.1 mmol/L   Chloride 102 98 - 111 mmol/L   CO2 30 22 -  32 mmol/L   Glucose, Bld 100 (H) 70 - 99 mg/dL   BUN 12 6 - 20 mg/dL   Creatinine, Ser 9.70 0.61 - 1.24 mg/dL   Calcium 9.2 8.9 - 26.3 mg/dL   Total Protein 7.3 6.5 - 8.1 g/dL   Albumin 3.1 (L) 3.5 - 5.0 g/dL   AST 12 (L) 15 - 41 U/L   ALT 14 0 - 44 U/L   Alkaline Phosphatase 42 38 - 126 U/L   Total Bilirubin 0.3 0.3 - 1.2 mg/dL   GFR calc non Af Amer >60 >60 mL/min   GFR calc Af Amer >60 >60 mL/min   Anion gap 8 5 - 15    Comment: Performed at [facility], [address] [place], [place] [address]  CBC     Status: Abnormal   Collection Time: 02/20/18  1:48 PM  Result Value Ref Range   WBC 19.8 (H) 4.0 - 10.5 K/uL   RBC 5.12 4.22 - 5.81 MIL/uL   Hemoglobin 8.9 (L) 13.0 - 17.0 g/dL    Comment: Reticulocyte Hemoglobin testing may be clinically indicated, consider ordering this additional test [id number]    HCT 34.4 (L) 39.0 - 52.0 %   MCV 67.2 (L) 80.0 - 100.0 fL  MCH 17.4 (L) 26.0 - 34.0 pg   MCHC 25.9 (L) 30.0 - 36.0 g/dL   RDW 16.123.2 (H) 09.611.5 - 04.515.5 %   Platelets 378 150 - 400 K/uL   nRBC 0.0 0.0 - 0.2 %    Comment: Performed at [facility], [address] [place], [place] [address]  Urinalysis, Routine w reflex microscopic     Status: None   Collection Time: 02/20/18  1:48 PM  Result Value Ref Range   Color, Urine YELLOW YELLOW   APPearance CLEAR CLEAR   Specific Gravity, Urine 1.020 1.005 - 1.030   pH 5.0 5.0 - 8.0   Glucose, UA NEGATIVE NEGATIVE mg/dL   Hgb urine dipstick NEGATIVE NEGATIVE   Bilirubin Urine NEGATIVE NEGATIVE   Ketones, ur NEGATIVE NEGATIVE mg/dL   Protein, ur NEGATIVE NEGATIVE mg/dL   Nitrite NEGATIVE NEGATIVE   Leukocytes, UA NEGATIVE NEGATIVE    Comment: Performed at [facility], [address] [place], [place] [address]   Ct Abdomen Pelvis W Contrast  Result Date: 02/20/2018 CLINICAL DATA:  Crohn disease status post partial colectomy with end colostomy. New swelling and foul-smelling  drainage adjacent to the colostomy. EXAM: CT ABDOMEN AND PELVIS WITH CONTRAST TECHNIQUE: Multidetector CT imaging of the abdomen and pelvis was performed using the standard protocol following bolus administration of intravenous contrast. CONTRAST:  100mL ISOVUE-300 IOPAMIDOL (ISOVUE-300) INJECTION 61% COMPARISON:  08/09/2017 CT abdomen/pelvis. FINDINGS: Lower chest: No significant pulmonary nodules or acute consolidative airspace disease. Hepatobiliary: Normal liver size. No liver mass. Normal gallbladder with no radiopaque cholelithiasis. No biliary ductal dilatation. Pancreas: Normal, with no mass or duct dilation. Spleen: Normal size. No mass. Adrenals/Urinary Tract: Normal adrenals. Normal kidneys with no hydronephrosis and no renal mass. Normal bladder. Stomach/Bowel: Patient is status post partial distal colectomy with end colostomy in the ventral left abdominal wall. There is a complex chronic left upper quadrant fistula involving the greater curvature of the body of the stomach, the distal transverse colon, and the proximal jejunum. 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 No small bowel dilatation or new sites of small bowel wall thickening. Appendix not discretely visualized. Collapsed normal rectal remnant. Vascular/Lymphatic: Normal caliber abdominal aorta. Patent portal, splenic, hepatic and renal veins. No pathologically enlarged lymph nodes in the abdomen or pelvis. Reproductive: Top-normal size prostate. Other: No pneumoperitoneum.  No ascites. Musculoskeletal: No aggressive appearing focal osseous lesions. IMPRESSION: 1. 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 2. The complex chronic left upper quadrant fistula involves the greater curvature of the body of the stomach, the proximal jejunum and the distal transverse colon. 3. No evidence of bowel obstruction. Electronically Signed   By: Delbert PhenixJason A Poff M.D.   On: 02/20/2018 20:15    Pending Labs Unresulted Labs (From admission, onward)   None      Vitals/Pain Today's Vitals   02/20/18 1344 02/20/18 1929 02/20/18 2145 02/20/18 2153  BP: 130/83 (!) 133/94 117/69   Pulse: (!) 101 75 63   Resp: 16 16 16    Temp: 99 F (37.2 C)  98.7 F (37.1 C)  TempSrc: Oral   Oral  SpO2: 100% 100% 100%   Weight:      Height:      PainSc:  0-No pain 0-No pain     Isolation Precautions No active isolations  Medications Medications  0.9 %  sodium chloride infusion ( Intravenous New Bag/Given 02/20/18 2145)  sodium chloride 0.9 % bolus 1,000 mL (1,000 mLs Intravenous New Bag/Given 02/20/18 2143)  sodium chloride flush (NS) 0.9 % injection 3 mL (3 mLs Intravenous Given 02/20/18 1940)  iopamidol (ISOVUE-300) 61 % injection 100 mL (100 mLs Intravenous Contrast Given 02/20/18 1949)    Mobility walks

## 2018-02-21 ENCOUNTER — Encounter (HOSPITAL_COMMUNITY)
Admission: EM | Disposition: A | Discharge status: Home / Self Care | Payer: Self-pay | Source: Home / Self Care | Attending: Internal Medicine

## 2018-02-21 ENCOUNTER — Inpatient Hospital Stay (HOSPITAL_COMMUNITY): Payer: BLUE CROSS/BLUE SHIELD | Admitting: Certified Registered"

## 2018-02-21 ENCOUNTER — Encounter (HOSPITAL_COMMUNITY): Payer: Self-pay | Admitting: Anesthesiology

## 2018-02-21 HISTORY — PX: DEBRIDEMENT OF ABDOMINAL WALL ABSCESS: SHX6396

## 2018-02-21 LAB — COMPREHENSIVE METABOLIC PANEL
ALT: 11 U/L (ref 0–44)
AST: 11 U/L — ABNORMAL LOW (ref 15–41)
Albumin: 2.5 g/dL — ABNORMAL LOW (ref 3.5–5.0)
Alkaline Phosphatase: 34 U/L — ABNORMAL LOW (ref 38–126)
Anion gap: 6 (ref 5–15)
BILIRUBIN TOTAL: 0.3 mg/dL (ref 0.3–1.2)
BUN: 13 mg/dL (ref 6–20)
CO2: 27 mmol/L (ref 22–32)
Calcium: 8.2 mg/dL — ABNORMAL LOW (ref 8.9–10.3)
Chloride: 105 mmol/L (ref 98–111)
Creatinine, Ser: 0.74 mg/dL (ref 0.61–1.24)
GFR calc Af Amer: >60 mL/min (ref >60)
GFR calc non Af Amer: >60 mL/min (ref >60)
Glucose, Bld: 72 mg/dL (ref 70–99)
Potassium: 3.7 mmol/L (ref 3.5–5.1)
Sodium: 138 mmol/L (ref 135–145)
Total Protein: 6.1 g/dL — ABNORMAL LOW (ref 6.5–8.1)

## 2018-02-21 LAB — CBC
HCT: 29 % — ABNORMAL LOW (ref 39.0–52.0)
Hemoglobin: 7.5 g/dL — ABNORMAL LOW (ref 13.0–17.0)
MCH: 17.5 pg — ABNORMAL LOW (ref 26.0–34.0)
MCHC: 25.9 g/dL — ABNORMAL LOW (ref 30.0–36.0)
MCV: 67.6 fL — ABNORMAL LOW (ref 80.0–100.0)
NRBC: 0.2 % (ref 0.0–0.2)
Platelets: 309 10*3/uL (ref 150–400)
RBC: 4.29 MIL/uL (ref 4.22–5.81)
RDW: 22.9 % — ABNORMAL HIGH (ref 11.5–15.5)
WBC: 11.9 10*3/uL — ABNORMAL HIGH (ref 4.0–10.5)

## 2018-02-21 LAB — SURGICAL PCR SCREEN
MRSA, PCR: NEGATIVE
Staphylococcus aureus: NEGATIVE

## 2018-02-21 SURGERY — DEBRIDEMENT OF ABDOMINAL WALL ABSCESS
Anesthesia: General | Site: Abdomen

## 2018-02-21 MED ORDER — LIDOCAINE 2% (20 MG/ML) 5 ML SYRINGE
INTRAMUSCULAR | Status: AC
  Filled 2018-02-21: qty 5

## 2018-02-21 MED ORDER — LACTATED RINGERS IV SOLN: INTRAVENOUS | Status: DC

## 2018-02-21 MED ORDER — HYDROMORPHONE HCL 1 MG/ML IJ SOLN
0.2500 mg | INTRAMUSCULAR | Status: DC | PRN
  Administered 2018-02-21: 0.25 mg via INTRAVENOUS

## 2018-02-21 MED ORDER — HYDROGEN PEROXIDE 3 % EX SOLN
CUTANEOUS | Status: DC | PRN
  Administered 2018-02-21: 1

## 2018-02-21 MED ORDER — HYDROCODONE-ACETAMINOPHEN 5-325 MG PO TABS
1.0000 | ORAL_TABLET | ORAL | Status: DC | PRN
  Administered 2018-02-21 – 2018-02-22 (×2): 2 via ORAL
  Filled 2018-02-21 (×2): qty 2

## 2018-02-21 MED ORDER — MORPHINE SULFATE (PF) 2 MG/ML IV SOLN
1.0000 mg | INTRAVENOUS | Status: DC | PRN
  Administered 2018-02-21 – 2018-02-24 (×6): 2 mg via INTRAVENOUS
  Filled 2018-02-21 (×6): qty 1

## 2018-02-21 MED ORDER — 0.9 % SODIUM CHLORIDE (POUR BTL) OPTIME
TOPICAL | Status: DC | PRN
  Administered 2018-02-21: 2000 mL

## 2018-02-21 MED ORDER — HYDROMORPHONE HCL 1 MG/ML IJ SOLN
INTRAMUSCULAR | Status: AC
  Filled 2018-02-21: qty 1

## 2018-02-21 MED ORDER — MIDAZOLAM HCL 2 MG/2ML IJ SOLN
INTRAMUSCULAR | Status: AC
  Filled 2018-02-21: qty 2

## 2018-02-21 MED ORDER — FENTANYL CITRATE (PF) 100 MCG/2ML IJ SOLN
INTRAMUSCULAR | Status: DC | PRN
  Administered 2018-02-21 (×3): 50 ug via INTRAVENOUS

## 2018-02-21 MED ORDER — ACETAMINOPHEN 160 MG/5ML PO SOLN: 325.0000 mg | Freq: Once | ORAL | Status: DC

## 2018-02-21 MED ORDER — DEXAMETHASONE SODIUM PHOSPHATE 10 MG/ML IJ SOLN
INTRAMUSCULAR | Status: DC | PRN
  Administered 2018-02-21: 4 mg via INTRAVENOUS

## 2018-02-21 MED ORDER — HYDROGEN PEROXIDE 3 % EX SOLN
CUTANEOUS | Status: AC
  Filled 2018-02-21: qty 473

## 2018-02-21 MED ORDER — PROPOFOL 10 MG/ML IV BOLUS
INTRAVENOUS | Status: AC
  Filled 2018-02-21: qty 20

## 2018-02-21 MED ORDER — DEXAMETHASONE SODIUM PHOSPHATE 10 MG/ML IJ SOLN
INTRAMUSCULAR | Status: AC
  Filled 2018-02-21: qty 1

## 2018-02-21 MED ORDER — ACETAMINOPHEN 325 MG PO TABS: 325.0000 mg | ORAL_TABLET | Freq: Once | ORAL | Status: DC

## 2018-02-21 MED ORDER — LIDOCAINE 2% (20 MG/ML) 5 ML SYRINGE
INTRAMUSCULAR | Status: DC | PRN
  Administered 2018-02-21: 80 mg via INTRAVENOUS

## 2018-02-21 MED ORDER — MIDAZOLAM HCL 2 MG/2ML IJ SOLN
INTRAMUSCULAR | Status: DC | PRN
  Administered 2018-02-21: 2 mg via INTRAVENOUS

## 2018-02-21 MED ORDER — PROPOFOL 10 MG/ML IV BOLUS
INTRAVENOUS | Status: DC | PRN
  Administered 2018-02-21: 200 mg via INTRAVENOUS

## 2018-02-21 MED ORDER — ONDANSETRON HCL 4 MG/2ML IJ SOLN
INTRAMUSCULAR | Status: AC
  Filled 2018-02-21: qty 2

## 2018-02-21 MED ORDER — FENTANYL CITRATE (PF) 250 MCG/5ML IJ SOLN
INTRAMUSCULAR | Status: AC
  Filled 2018-02-21: qty 5

## 2018-02-21 MED ORDER — ACETAMINOPHEN 10 MG/ML IV SOLN
INTRAVENOUS | Status: AC
  Filled 2018-02-21: qty 100

## 2018-02-21 MED ORDER — MEPERIDINE HCL 50 MG/ML IJ SOLN: 6.2500 mg | INTRAMUSCULAR | Status: DC | PRN

## 2018-02-21 MED ORDER — ACETAMINOPHEN 10 MG/ML IV SOLN
1000.0000 mg | Freq: Once | INTRAVENOUS | Status: DC | PRN
  Administered 2018-02-21: 1000 mg via INTRAVENOUS

## 2018-02-21 MED ORDER — ROCURONIUM BROMIDE 100 MG/10ML IV SOLN
INTRAVENOUS | Status: AC
  Filled 2018-02-21: qty 1

## 2018-02-21 MED ORDER — ONDANSETRON HCL 4 MG/2ML IJ SOLN
INTRAMUSCULAR | Status: DC | PRN
  Administered 2018-02-21: 4 mg via INTRAVENOUS

## 2018-02-21 MED ORDER — PROMETHAZINE HCL 25 MG/ML IJ SOLN: 6.2500 mg | INTRAMUSCULAR | Status: DC | PRN

## 2018-02-21 SURGICAL SUPPLY — 29 items
BLADE SURG 15 STRL LF DISP TIS (BLADE) IMPLANT
BLADE SURG 15 STRL SS (BLADE)
BNDG GAUZE ELAST 4 BULKY (GAUZE/BANDAGES/DRESSINGS) IMPLANT
COVER WAND RF STERILE (DRAPES) IMPLANT
DECANTER SPIKE VIAL GLASS SM (MISCELLANEOUS) IMPLANT
DRAPE LAPAROSCOPIC ABDOMINAL (DRAPES) ×2 IMPLANT
DRSG PAD ABDOMINAL 8X10 ST (GAUZE/BANDAGES/DRESSINGS) IMPLANT
ELECT PENCIL ROCKER SW 15FT (MISCELLANEOUS) ×2 IMPLANT
ELECT REM PT RETURN 15FT ADLT (MISCELLANEOUS) ×2 IMPLANT
GAUZE SPONGE 4X4 12PLY STRL (GAUZE/BANDAGES/DRESSINGS) ×2 IMPLANT
GLOVE BIO SURGEON STRL SZ7.5 (GLOVE) ×2 IMPLANT
GOWN SPEC L4 XLG W/TWL (GOWN DISPOSABLE) ×2 IMPLANT
GOWN STRL REUS W/ TWL XL LVL3 (GOWN DISPOSABLE) ×3 IMPLANT
GOWN STRL REUS W/TWL XL LVL3 (GOWN DISPOSABLE) ×3
KIT BASIN OR (CUSTOM PROCEDURE TRAY) ×2 IMPLANT
NEEDLE HYPO 25X1 1.5 SAFETY (NEEDLE) IMPLANT
PACK BASIC VI WITH GOWN DISP (CUSTOM PROCEDURE TRAY) ×2 IMPLANT
SPONGE LAP 18X18 RF (DISPOSABLE) ×2 IMPLANT
SUT MNCRL AB 4-0 PS2 18 (SUTURE) IMPLANT
SUT VIC AB 3-0 SH 27 (SUTURE)
SUT VIC AB 3-0 SH 27XBRD (SUTURE) IMPLANT
SWAB COLLECTION DEVICE MRSA (MISCELLANEOUS) ×2 IMPLANT
SWAB CULTURE ESWAB REG 1ML (MISCELLANEOUS) IMPLANT
SYR 20CC LL (SYRINGE) IMPLANT
SYR BULB IRRIGATION 50ML (SYRINGE) ×2 IMPLANT
TAPE CLOTH SURG 6X10 WHT LF (GAUZE/BANDAGES/DRESSINGS) ×2 IMPLANT
TOWEL OR 17X26 10 PK STRL BLUE (TOWEL DISPOSABLE) ×2 IMPLANT
TOWEL OR NON WOVEN STRL DISP B (DISPOSABLE) ×2 IMPLANT
YANKAUER SUCT BULB TIP 10FT TU (MISCELLANEOUS) ×2 IMPLANT

## 2018-02-21 NOTE — Anesthesia Procedure Notes (Signed)
Procedure Name: LMA Insertion Date/Time: 02/21/2018 10:07 AM Performed by: Nelle Don, CRNA Pre-anesthesia Checklist: Patient being monitored, Suction available, Emergency Drugs available and Patient identified Patient Re-evaluated:Patient Re-evaluated prior to induction Oxygen Delivery Method: Circle system utilized Preoxygenation: Pre-oxygenation with 100% oxygen Induction Type: IV induction Ventilation: Mask ventilation without difficulty LMA: LMA inserted LMA Size: 4.0 Dental Injury: Teeth and Oropharynx as per pre-operative assessment

## 2018-02-21 NOTE — Anesthesia Preprocedure Evaluation (Addendum)
Anesthesia Evaluation  Patient identified by MRN, date of birth, ID band Patient awake    Reviewed: Allergy & Precautions, NPO status , Patient's Chart, lab work & pertinent test results  Airway Mallampati: I  TM Distance: >3 FB Neck ROM: Full    Dental  (+) Teeth Intact, Dental Advisory Given   Pulmonary neg pulmonary ROS,    breath sounds clear to auscultation       Cardiovascular negative cardio ROS   Rhythm:Regular Rate:Normal     Neuro/Psych negative neurological ROS  negative psych ROS   GI/Hepatic negative GI ROS, Neg liver ROS,   Endo/Other  negative endocrine ROS  Renal/GU negative Renal ROS     Musculoskeletal negative musculoskeletal ROS (+)   Abdominal Normal abdominal exam  (+)   Peds  Hematology negative hematology ROS (+)   Anesthesia Other Findings   Reproductive/Obstetrics                            Anesthesia Physical Anesthesia Plan  ASA: II  Anesthesia Plan: General   Post-op Pain Management:    Induction: Intravenous  PONV Risk Score and Plan: 3 and Ondansetron, Dexamethasone and Midazolam  Airway Management Planned: LMA  Additional Equipment: None  Intra-op Plan:   Post-operative Plan: Extubation in OR  Informed Consent: I have reviewed the patients History and Physical, chart, labs and discussed the procedure including the risks, benefits and alternatives for the proposed anesthesia with the patient or authorized representative who has indicated his/her understanding and acceptance.     Dental advisory given  Plan Discussed with: CRNA  Anesthesia Plan Comments:       Anesthesia Quick Evaluation

## 2018-02-21 NOTE — Op Note (Signed)
02/20/2018 - 02/21/2018  10:32 AM  PATIENT:  Tom Ferguson, 28 y.o., male, MRN: [id number]  PREOP DIAGNOSIS:  abdominal wall abscess  POSTOP DIAGNOSIS:   Abdominal wall abscess (7 cm x 10 cm)  PROCEDURE:   Procedure(s):  Incision and DEBRIDEMENT OF ABDOMINAL WALL ABSCESS  SURGEON:   Alphonsa Overall, M.D.  ANESTHESIA:   general  Anesthesiologist: [name], MD CRNA: Niel Hummer, CRNA  General  EBL:  100  ml  BLOOD ADMINISTERED: none  DRAINS: the wound was packed with 4" Curlex  LOCAL MEDICATIONS USED:   None3  SPECIMEN:   Cultures obtained  COUNTS CORRECT:  YES  INDICATIONS FOR PROCEDURE:  [name] is a 28 y.o. (DOB: 1990/05/09) AA male whose primary care physician is Scot Jun, FNP.   The patient has Crohn's disease and presented with this anterior abdominal abscess possibly related to a enterocutaneous fistula.  He comes for drainage of this abscess.   The indications and risks of the surgery were explained to the patient.  The risks include, but are not limited to, infection, bleeding, and nerve injury.  PROCEDURE: The patient was taken to room #1 at [facility].  He was given general anesthesia and placed in the supine position.  A timeout was held the surgical checklist run.  His abdomen was prepped with Betadine and sterilely draped.  I made a midline incision in his upper mid abdomen.  He has an approximate 7 cm x 10 cm abscess that I drained.  Aerobic and anaerobic cultures were obtained.  I irrigated the wound with 2 liters of saline and hydrogen peroxide.  I then packed the wound with a 4 inch Kerlix gauze soaked in saline.  And the wound was sterilely dressed.  The patient tolerated the procedure well.  He was transferred to recovery in good condition.  Alphonsa Overall, MD, [facility] Surgery Pager: [phone] Office phone:  [phone]

## 2018-02-21 NOTE — Consult Note (Signed)
[facility] Consult  Referring Provider: Vanetta Mulders, MD/ER Primary Care Physician:  Bing Neighbors, FNP Primary Gastroenterologist: Dr.Schooler/[facility]  Reason for Consultation:  Crohn's disease, abdominal wall abscess, fistula  HPI: Tom Ferguson is a 28 y.o. male with history of Crohn's disease diagnosed in 2004 in [place] presented to the ER with 1 month history of abdominal wall fullness, soreness, and rupture, drainage of abscess in the ER.  Patient was diagnosed with Crohn's disease at age 18 when he had unintentional weight loss of over 100 pounds within 6 months. He was started on prednisone and was on Remicade for over 10 years, which had to be discontinued when he lost his insurance. In 2008 he developed a colonic perforation and required a colostomy performed in [place]. Patient has been on high-dose of prednisone for most of 2019, reports control of symptoms at 40 mg, worsening of symptoms with taper, has been started on Humira at least for the last 6 months, last dose was on 02/16/2018. He empties his colostomy bag around twice a day, denies blood in stool or loose stools. Has had a good appetite, denies unintentional weight loss recently, however, continued to have mid abdominal fullness for the past 1 month which he was not aware was related to an abscess. A colonoscopy was attempted in summer of 2019, as per patient the scope could not be passed beyond the stoma due to stricture. 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  Past Medical History:  Diagnosis Date  . Crohn's disease (HCC)   . DVT (deep venous thrombosis) (HCC)     Past Surgical History:  Procedure Laterality Date  . COLON SURGERY     colostomy  . COLOSTOMY      Prior to Admission medications   Medication Sig Start Date End Date Taking? Authorizing Provider  Adalimumab (HUMIRA) 40 MG/0.4ML PSKT Inject 40 mg into the skin every 14 (fourteen) days.   Yes [provider]  ibuprofen (ADVIL,MOTRIN) 800 MG tablet Take 800 mg by mouth every 8 (eight) hours as needed for moderate pain.   Yes [provider]  predniSONE (DELTASONE) 10 MG tablet Take 40 mg by mouth daily with breakfast.    Yes [provider]  dicyclomine (BENTYL) 20 MG tablet Take 1 tablet (20 mg total) by mouth 2 (two) times daily. Patient not taking: Reported on 12/05/2017 10/08/17   Frederik Pear A, PA-C  moxifloxacin (VIGAMOX) 0.5 % ophthalmic solution Every 4 hours until seen by opthalmology Patient not taking: Reported on 02/20/2018 12/05/17   Belinda Fisher, PA-C  Polyethyl Glycol-Propyl Glycol (SYSTANE) 0.4-0.3 % GEL ophthalmic gel Place 1 application into both eyes at bedtime as needed. Patient not taking: Reported on 02/20/2018 12/05/17   Belinda Fisher, PA-C    Current Facility-Administered Medications  Medication Dose Route Frequency Provider Last Rate Last Dose  . 0.9 %  sodium chloride infusion   Intravenous Continuous Vanetta Mulders, MD 100  mL/hr at 02/20/18 2145    . 0.9 %  sodium chloride infusion   Intravenous Continuous Rometta Emery, MD 75 mL/hr at 02/20/18 2231    . [MAR Hold] heparin injection 5,000 Units  5,000 Units Subcutaneous Q8H Rometta Emery, MD   5,000 Units at 02/20/18 2238  . [MAR Hold] ibuprofen (ADVIL,MOTRIN) tablet 800 mg  800 mg Oral Q8H PRN Rometta Emery, MD      . Mitzi Hansen Hold] ketorolac (TORADOL) 15 MG/ML injection 15 mg  15 mg Intravenous Q6H PRN Earlie Lou L, MD   15 mg at 02/21/18 0400  . [MAR Hold] meropenem (MERREM) 1 g in sodium chloride 0.9 % 100 mL IVPB  1 g Intravenous Q8H Lorenza Evangelist, RPH 200 mL/hr at 02/21/18 0534 1 g at 02/21/18 0534  . [MAR Hold] ondansetron (ZOFRAN) tablet 4 mg  4 mg Oral Q6H PRN Rometta Emery, MD       Or  . Mitzi Hansen Hold] ondansetron (ZOFRAN) injection 4 mg  4 mg Intravenous Q6H PRN Rometta Emery, MD      . Mitzi Hansen Hold] predniSONE (DELTASONE) tablet 40 mg  40 mg Oral Q breakfast Rometta Emery, MD        Allergies as of 02/20/2018  . (No Known Allergies)    Family History  Problem Relation Age of Onset  . Hypertension Mother   . Non-Hodgkin's lymphoma Father     Social History   Socioeconomic History  . Marital status: Single    Spouse name: Not on file  . Number of children: Not on file  . Years of education: Not on file  . Highest education level: Not on file  Occupational History  . Not on file  Social Needs  . Financial resource strain: Not on file  . Food insecurity:    Worry: Not on file    Inability: Not on file  . Transportation needs:    Medical: Not on file    Non-medical: Not on file  Tobacco Use  . Smoking status: Never Smoker  . Smokeless tobacco: Never Used  Substance and Sexual Activity  . Alcohol use: Yes    Comment: occ  . Drug use: Yes    Types: Marijuana  . Sexual activity: Not on file  Lifestyle  . Physical activity:    Days per week: Not on file    Minutes per session: Not on file  . Stress: Not on file  Relationships  . Social connections:    Talks on phone: Not on file    Gets together: Not on file    Attends religious service: Not on file    Active member of club or organization: Not on file    Attends meetings of clubs or organizations: Not on file    Relationship status: Not on file  . Intimate partner violence:    Fear of current  or ex partner: Not on file    Emotionally abused: Not on file    Physically abused: Not on file    Forced sexual activity: Not on file  Other Topics Concern  . Not on file  Social History Narrative  . Not on file    Review of Systems: Positive for: GI: Described in detail in HPI.    Gen: Denies any fever, chills, rigors, night sweats, anorexia, fatigue, weakness, malaise, involuntary weight loss, and sleep disorder CV: Denies chest pain, angina, palpitations, syncope, orthopnea, PND, peripheral edema, and claudication. Resp: Denies dyspnea,  cough, sputum, wheezing, coughing up blood. GU : Denies urinary burning, blood in urine, urinary frequency, urinary hesitancy, nocturnal urination, and urinary incontinence. MS: Denies joint pain or swelling.  Denies muscle weakness, cramps, atrophy.  Derm: Denies rash, itching, oral ulcerations, hives, unhealing ulcers.  Psych: Denies depression, anxiety, memory loss, suicidal ideation, hallucinations,  and confusion. Heme: Denies bruising, bleeding, and enlarged lymph nodes. Neuro:  Denies any headaches, dizziness, paresthesias. Endo:  Denies any problems with DM, thyroid, adrenal function.  Physical Exam: Vital signs in last 24 hours: Temp:  [97.9 F (36.6 C)-99 F (37.2 C)] 97.9 F (36.6 C) (02/09 0606) Pulse Rate:  [52-101] 52 (02/09 0606) Resp:  [14-16] 16 (02/09 0606) BP: (96-133)/(58-94) 96/58 (02/09 0606) SpO2:  [98 %-100 %] 99 % (02/09 0606) Weight:  [81.6 kg] 81.6 kg (02/08 1343) Last BM Date: 02/20/18  General:   Alert,  Well-developed, pleasant and cooperative in NAD Head:  Normocephalic and atraumatic. Eyes:  Sclera clear, no icterus.  Prominent pallor Ears:  Normal auditory acuity. Nose:  No deformity, discharge,  or lesions. Mouth:  No deformity or lesions.  Oropharynx pink & moist. Neck:  Supple; no masses or thyromegaly. Lungs:  Clear throughout to auscultation.   No wheezes, crackles, or rhonchi. No acute  distress. Heart:  Regular rate and rhythm; no murmurs, clicks, rubs,  or gallops. Extremities:  Without clubbing or edema. Neurologic:  Alert and  oriented x4;  grossly normal neurologically. Skin:  Intact without significant lesions or rashes. Psych:  Alert and cooperative. Normal mood and affect. Abdomen:  Colostomy in the left side of the abdomen draining semi-liquid/semisolid brown stool Midline protuberance with free-flowing pus No obvious surrounding erythema         Lab Results: Recent Labs    02/20/18 1348 02/21/18 0519  WBC 19.8* 11.9*  HGB 8.9* 7.5*  HCT 34.4* 29.0*  PLT 378 309   BMET Recent Labs    02/20/18 1348 02/21/18 0519  NA 140 138  K 3.5 3.7  CL 102 105  CO2 30 27  GLUCOSE 100* 72  BUN 12 13  CREATININE 0.91 0.74  CALCIUM 9.2 8.2*   LFT Recent Labs    02/21/18 0519  PROT 6.1*  ALBUMIN 2.5*  AST 11*  ALT 11  ALKPHOS 34*  BILITOT 0.3   PT/INR No results for input(s): LABPROT, INR in the last 72 hours.  Studies/Results: Ct Abdomen Pelvis W Contrast  Result Date: 02/20/2018 CLINICAL DATA:  Crohn disease status post partial colectomy with end colostomy. New swelling and foul-smelling drainage adjacent to the colostomy. EXAM: CT ABDOMEN AND PELVIS WITH CONTRAST TECHNIQUE: Multidetector CT imaging of the abdomen and pelvis was performed using the standard protocol following bolus administration of intravenous contrast. CONTRAST:  100mL ISOVUE-300 IOPAMIDOL (ISOVUE-300) INJECTION 61% COMPARISON:  08/09/2017 CT abdomen/pelvis. FINDINGS: Lower chest: No significant pulmonary nodules or acute consolidative airspace disease. Hepatobiliary: Normal liver size. No liver mass. Normal gallbladder with no radiopaque cholelithiasis. No biliary ductal dilatation. Pancreas: Normal, with no mass or duct dilation. Spleen: Normal size. No mass. Adrenals/Urinary Tract: Normal adrenals. Normal kidneys with no hydronephrosis and no renal mass. Normal bladder.  Stomach/Bowel: Patient is status post partial distal colectomy with end colostomy in the ventral left abdominal wall. There is a complex chronic left upper quadrant fistula involving the greater curvature of the body of the stomach, the distal transverse colon, and the proximal jejunum. There is a new enterocutaneous fistula arising from this complex chronic left upper quadrant  fistula and tracking to the skin surface just to the left of midline in the supraumbilical region, complicated by a new 8.0 x 5.2 x 6.1 cm gas containing subcutaneous abscess in the midline with thick enhancing wall (series 2/image 35). No small bowel dilatation or new sites of small bowel wall thickening. Appendix not discretely visualized. Collapsed normal rectal remnant. Vascular/Lymphatic: Normal caliber abdominal aorta. Patent portal, splenic, hepatic and renal veins. No pathologically enlarged lymph nodes in the abdomen or pelvis. Reproductive: Top-normal size prostate. Other: No pneumoperitoneum.  No ascites. Musculoskeletal: No aggressive appearing focal osseous lesions. IMPRESSION: 1. 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 2. The complex chronic left upper quadrant fistula involves the greater curvature of the body of the stomach, the proximal jejunum and the distal transverse colon. 3. No evidence of bowel obstruction. Electronically Signed   By: Delbert PhenixJason A Poff M.D.   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 Ezzard StandingNewman On IV meropenem every 8 hours Would recommend holding prednisone for  now Depending upon surgical findings, patient may need combination of Biologics(Humira) and immunosuppressant(6MP or AZA) to help with fistula closure once infection is cleared.   LOS: 1 day   Kerin SalenArya Iantha Titsworth, M.D.  02/21/2018, 9:04 AM  Pager [phone] If no answer or after 5 PM call [phone]

## 2018-02-21 NOTE — Anesthesia Postprocedure Evaluation (Signed)
Anesthesia Post Note  Patient: Tom Ferguson  Procedure(s) Performed: DEBRIDEMENT OF ABDOMINAL WALL ABSCESS (N/A Abdomen)     Patient location during evaluation: PACU Anesthesia Type: General Level of consciousness: awake and alert Pain management: pain level controlled Vital Signs Assessment: post-procedure vital signs reviewed and stable Respiratory status: spontaneous breathing, nonlabored ventilation, respiratory function stable and patient connected to nasal cannula oxygen Cardiovascular status: blood pressure returned to baseline and stable Postop Assessment: no apparent nausea or vomiting Anesthetic complications: no    Last Vitals:  Vitals:   02/21/18 1130 02/21/18 1152  BP: 140/83 122/83  Pulse: (!) 45 (!) 51  Resp: 11 15  Temp: (!) 36.3 C 36.5 C  SpO2: 100% 100%    Last Pain:  Vitals:   02/21/18 1152  TempSrc: Oral  PainSc:                  [name]

## 2018-02-21 NOTE — Progress Notes (Signed)
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  Patient was taken for I&D on 2/9 by general surgery.  Gastroenterology was consulted.   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  General surgery following.  Local wound care -Gastroenterology following, hold off on prednisone -Clear liquid diet today, pain control - Follow-up culture data -IV fluids as needed -Continue meropenem every 8 hours  Anemia of chronic disease -Closely follow hemoglobin.  DVT prophylaxis: Subcu heparin Code Status: Full code Family Communication: Friend at bedside Disposition Plan: Maintain inpatient stay for IV antibiotics  Consultants:   Gastroenterology  General surgery  Procedures:   I&D of the abdominal wall  Antimicrobials:   Meropenem day 2   Subjective: Patient is status post I&D today, tolerated the procedure well.  Review of Systems Otherwise negative except as per HPI, including: General: Denies fever, chills, night sweats or unintended weight loss. Resp: Denies cough, wheezing, shortness of breath. Cardiac: Denies chest pain, palpitations, orthopnea, paroxysmal nocturnal dyspnea. GI: Denies abdominal pain, nausea, vomiting, diarrhea or constipation GU: Denies dysuria, frequency, hesitancy or  incontinence MS: Denies muscle aches, joint pain or swelling Neuro: Denies headache, neurologic deficits (focal weakness, numbness, tingling), abnormal gait Psych: Denies anxiety, depression, SI/HI/AVH Skin: Denies new rashes or lesions ID: Denies sick contacts, exotic exposures, travel  Objective: Vitals:   02/21/18 1100 02/21/18 1115 02/21/18 1130 02/21/18 1152  BP: (!) 138/94 139/86 140/83 122/83  Pulse: (!) 52 (!) 43 (!) 45 (!) 51  Resp: 16 10 11 15   Temp:   (!) 97.4 F (36.3 C) 97.7 F (36.5 C)  TempSrc:    Oral  SpO2: 100% 100% 100% 100%  Weight:      Height:        Intake/Output Summary (Last 24 hours) at 02/21/2018 1222 Last data filed at 02/21/2018 1051 Gross per 24 hour  Intake 400 ml  Output 10 ml  Net 390 ml   Filed Weights   02/20/18 1343  Weight: 81.6 kg    Examination:  General exam: Appears calm and comfortable  Respiratory system: Clear to auscultation. Respiratory effort normal. Cardiovascular system: S1 & S2 heard, RRR. No JVD, murmurs, rubs, gallops or clicks. No pedal edema. Gastrointestinal system: Abdomen is nondistended, soft and nontender. No organomegaly or masses felt. Normal bowel sounds heard. Central nervous system: Alert and oriented. No focal neurological deficits. Extremities: Symmetric 5 x 5 power. Skin: No rashes, lesions or ulcers Psychiatry: Judgement and insight appear normal. Mood & affect appropriate.  Abdominal dressing noted around the I&D site, colostomy bag noted.   Data Reviewed:   CBC: Recent Labs  Lab 02/20/18 1348 02/21/18 0519  WBC 19.8* 11.9*  HGB 8.9* 7.5*  HCT 34.4* 29.0*  MCV 67.2* 67.6*  PLT 378 283   Basic Metabolic Panel: Recent Labs  Lab 02/20/18 1348 02/21/18 0519  NA 140  138  K 3.5 3.7  CL 102 105  CO2 30 27  GLUCOSE 100* 72  BUN 12 13  CREATININE 0.91 0.74  CALCIUM 9.2 8.2*   GFR: Estimated Creatinine Clearance: 157.3 mL/min (by C-G formula based on SCr of 0.74 mg/dL). Liver Function  Tests: Recent Labs  Lab 02/20/18 1348 02/21/18 0519  AST 12* 11*  ALT 14 11  ALKPHOS 42 34*  BILITOT 0.3 0.3  PROT 7.3 6.1*  ALBUMIN 3.1* 2.5*   Recent Labs  Lab 02/20/18 1348  LIPASE 27   No results for input(s): AMMONIA in the last 168 hours. Coagulation Profile: No results for input(s): INR, PROTIME in the last 168 hours. Cardiac Enzymes: No results for input(s): CKTOTAL, CKMB, CKMBINDEX, TROPONINI in the last 168 hours. BNP (last 3 results) No results for input(s): PROBNP in the last 8760 hours. HbA1C: No results for input(s): HGBA1C in the last 72 hours. CBG: No results for input(s): GLUCAP in the last 168 hours. Lipid Profile: No results for input(s): CHOL, HDL, LDLCALC, TRIG, CHOLHDL, LDLDIRECT in the last 72 hours. Thyroid Function Tests: No results for input(s): TSH, T4TOTAL, FREET4, T3FREE, THYROIDAB in the last 72 hours. Anemia Panel: No results for input(s): VITAMINB12, FOLATE, FERRITIN, TIBC, IRON, RETICCTPCT in the last 72 hours. Sepsis Labs: No results for input(s): PROCALCITON, LATICACIDVEN in the last 168 hours.  Recent Results (from the past 240 hour(s))  Surgical pcr screen     Status: None   Collection Time: 02/21/18  7:23 AM  Result Value Ref Range Status   MRSA, PCR NEGATIVE NEGATIVE Final   Staphylococcus aureus NEGATIVE NEGATIVE Final    Comment: (NOTE) The Xpert SA Assay (FDA approved for NASAL specimens in patients 81 years of age and older), is one component of a comprehensive surveillance program. It is not intended to diagnose infection nor to guide or monitor treatment. Performed at [facility], [place] [address] [place], [place] [address]          Radiology Studies: Ct Abdomen Pelvis W Contrast  Result Date: 02/20/2018 CLINICAL DATA:  Crohn disease status post partial colectomy with end colostomy. New swelling and foul-smelling drainage adjacent to the colostomy. EXAM: CT ABDOMEN AND PELVIS WITH CONTRAST  TECHNIQUE: Multidetector CT imaging of the abdomen and pelvis was performed using the standard protocol following bolus administration of intravenous contrast. CONTRAST:  122m ISOVUE-300 IOPAMIDOL (ISOVUE-300) INJECTION 61% COMPARISON:  08/09/2017 CT abdomen/pelvis. FINDINGS: Lower chest: No significant pulmonary nodules or acute consolidative airspace disease. Hepatobiliary: Normal liver size. No liver mass. Normal gallbladder with no radiopaque cholelithiasis. No biliary ductal dilatation. Pancreas: Normal, with no mass or duct dilation. Spleen: Normal size. No mass. Adrenals/Urinary Tract: Normal adrenals. Normal kidneys with no hydronephrosis and no renal mass. Normal bladder. Stomach/Bowel: Patient is status post partial distal colectomy with end colostomy in the ventral left abdominal wall. There is a complex chronic left upper quadrant fistula involving the greater curvature of the body of the stomach, the distal transverse colon, and the proximal jejunum. 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 No small bowel dilatation or new sites of small bowel wall thickening. Appendix not discretely visualized. Collapsed normal rectal remnant. Vascular/Lymphatic: Normal caliber abdominal aorta. Patent  portal, splenic, hepatic and renal veins. No pathologically enlarged lymph nodes in the abdomen or pelvis. Reproductive: Top-normal size prostate. Other: No pneumoperitoneum.  No ascites. Musculoskeletal: No aggressive appearing focal osseous lesions. IMPRESSION: 1. 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 2. The complex chronic left upper quadrant fistula involves the greater curvature of the body of the stomach, the proximal jejunum and the distal transverse colon. 3. No evidence of bowel obstruction. Electronically Signed   By: Ilona Sorrel M.D.   On: 02/20/2018 20:15        Scheduled Meds: . heparin  5,000 Units Subcutaneous Q8H  . HYDROmorphone       Continuous Infusions: . sodium chloride 100 mL/hr at 02/21/18 1158  . sodium chloride 75 mL/hr at 02/20/18 2231  . acetaminophen    . meropenem (MERREM) IV 1 g (02/21/18 0534)     LOS: 1 day   Time spent= 35 mins    Jamas Jaquay Arsenio Loader, MD [facility]  If 7PM-7AM, please contact night-coverage [email or web address] 02/21/2018, 12:22 PM

## 2018-02-21 NOTE — Consult Note (Signed)
Re:   Tom Ferguson DOB:   02/06/1990 MRN:   [id number]  Chief Complaint Abdominal infection  ASSESEMENT AND PLAN: 1.  Abdominal wall abscess - apparently from fistula  WBC - 19,800 - 02/20/2018  On Merropenem  Plan I&D of abscess this AM  2.  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  On CT scan there is a suggestion of stomach involement (which would be unusual)  3.  Crohn's disease  Has been on Prednisoen 40 mg for at least 6 weeks.  On Humira  To consult Dr. Michail Sermon for assist in management of Crohn's disease 4.  History of DVT's  Early 2010's - agree with prophylaxis 5.  Anemia  Hgb - 8.9 - 02/20/2018 6.  Malnutrition  Alb - 3.1 - 02/20/2018  Check prealbumin  Chief Complaint  Patient presents with  . Abdominal Pain   PHYSICIAN REQUESTING CONSULTATION:  Dr. Gloris Manchester, [facility]  HISTORY OF PRESENT ILLNESS: Tom Ferguson is a 28 y.o. (DOB: 18-Jun-1990)  AA male whose primary care physician is Scot Jun, FNP.  He is seeing Dr. Warnell Bureau for GI.  He is by himself.   He has had Crohn's since age 9.Marland Kitchen  He had a colostomy placed in [place] in 2008 for a perforation of his colon.  The perforation was due to the Crohn's.    He moved here from [place] in 2015 to go to school at [employer].  But lost insurance when he could not afford to go to school.  He was on Remicaide for years, but lost insurance in 2018.  Since then, he has used Prednisone and Humira to control his disease.  He is followed by Dr. Michail Sermon and this has been his most consistent medical follow up.  Dr. Michail Sermon tried to colonoscope him in May 2019, but could not get through his ostomy.  He is unsure of his last upper endo.  Over the last 6 weeks - he had an increasing mass in his mid abdomen develop.  This is tender.  He came to the [facility] last PM and this mass started to spontaneously drain.  CT scan of abdomen - 02/20/2018 - 1. New enterocutaneous fistula arising from a complex chronic left upper  quadrant fistula, complicated by a new large gas-containing abscess in the midline subcutaneous supraumbilical ventral abdominal wall along the enterocutaneous fistula tract.  2. The complex chronic left upper quadrant fistula involves the greater curvature of the body of the stomach, the proximal jejunum and the distal transverse colon.  WBC - 19,800 - 02/20/2018 Hgb - 8.9 - 02/20/2018    Past Medical History:  Diagnosis Date  . Crohn's disease ([place])   . DVT (deep venous thrombosis) ([place])       Past Surgical History:  Procedure Laterality Date  . COLON SURGERY     colostomy  . COLOSTOMY        Current Facility-Administered Medications  Medication Dose Route Frequency Provider Last Rate Last Dose  . 0.9 %  sodium chloride infusion   Intravenous Continuous Fredia Sorrow, MD 100 mL/hr at 02/20/18 2145    . 0.9 %  sodium chloride infusion   Intravenous Continuous Elwyn Reach, MD 75 mL/hr at 02/20/18 2231    . heparin injection 5,000 Units  5,000 Units Subcutaneous Q8H Elwyn Reach, MD   5,000 Units at 02/20/18 2238  . ibuprofen (ADVIL,MOTRIN) tablet 800 mg  800 mg Oral Q8H PRN Elwyn Reach, MD      .  ketorolac (TORADOL) 15 MG/ML injection 15 mg  15 mg Intravenous Q6H PRN Gala Romney L, MD   15 mg at 02/21/18 0400  . meropenem (MERREM) 1 g in sodium chloride 0.9 % 100 mL IVPB  1 g Intravenous Q8H Dorrene German, RPH 200 mL/hr at 02/21/18 0534 1 g at 02/21/18 0534  . ondansetron (ZOFRAN) tablet 4 mg  4 mg Oral Q6H PRN Elwyn Reach, MD       Or  . ondansetron (ZOFRAN) injection 4 mg  4 mg Intravenous Q6H PRN Garba, Mohammad L, MD      . predniSONE (DELTASONE) tablet 40 mg  40 mg Oral Q breakfast Elwyn Reach, MD         No Known Allergies  REVIEW OF SYSTEMS: Skin:  No history of rash.  No history of abnormal moles. Infection:  No history of hepatitis or HIV.  No history of MRSA. Neurologic:  No history of stroke.  No history of seizure.  No history  of headaches. Cardiac:  No history of hypertension. No history of heart disease.  No history of prior cardiac catheterization.  No history of seeing a cardiologist.  History of DVT's Pulmonary:  Does not smoke cigarettes.  No asthma or bronchitis.  No OSA/CPAP.  Endocrine:  No diabetes. No thyroid disease. Gastrointestinal:  See HPI.  All his medical problems have In some way been related to his Crohn's. Urologic:  No history of kidney stones.  No history of bladder infections. Musculoskeletal:  No history of joint or back disease. Hematologic:  No bleeding disorder.  No history of anemia.  Not anticoagulated. Psycho-social:  The patient is oriented.   The patient has no obvious psychologic or social impairment to understanding our conversation and plan.  SOCIAL and FAMILY HISTORY: Unmarried. His family is in [place].  He works for [employer].  PHYSICAL EXAM: BP (!) 96/58 (BP Location: Left Arm)   Pulse (!) 52   Temp 97.9 F (36.6 C) (Oral)   Resp 16   Ht 6' 2"  (1.88 m)   Wt 81.6 kg   SpO2 99%   BMI 23.11 kg/m   General: Pleasant thin AA M who is alert. Skin:  Inspection and palpation - no mass or rash. Eyes:  Conjunctiva and lids unremarkable.            Pupils are equal Ears, Nose, Mouth, and Throat:  Ears and nose unremarkable            Lips and teeth are unremarable. Neck: Supple. No mass, trachea midline.  No thyroid mass. Lymph Nodes:  No supraclavicular, cervical, or inguinal nodes. Lungs: Normal respiratory effort.  Clear to auscultation and symmetric breath sounds. Heart:  Palpation of the heart is normal.            Auscultation: RRR. No murmur or rub.  Abdomen: Soft. Colostomy in left upper abdomen.  He has a 10 - 12 cm abscess in his mid abdomen that is spontaneously draining pus. Rectal: Not done. Musculoskeletal:  Normal gait.            Good muscle strength and ROM  in upper and lower extremities.  Neurologic:  Grossly intact to motor and sensory  function. Psychiatric: Normal judgement and insight. Behavior is normal.            Oriented to time, person, place.   DATA REVIEWED, COUNSELING AND COORDINATION OF CARE: Epic notes reviewed. Counseling and coordination of care exceeded more than  50% of the time spent with patient. Total time spent with patient and charting: 50 minutes  Alphonsa Overall, MD,  [facility], [address]  [place], [place]    [place] Phone:  [phone] FAX:  [phone]

## 2018-02-21 NOTE — Transfer of Care (Signed)
Immediate Anesthesia Transfer of Care Note  Patient: Tom Ferguson  Procedure(s) Performed: DEBRIDEMENT OF ABDOMINAL WALL ABSCESS (N/A Abdomen)  Patient Location: PACU  Anesthesia Type:General  Level of Consciousness: awake, alert  and oriented  Airway & Oxygen Therapy: Patient Spontanous Breathing and Patient connected to face mask oxygen  Post-op Assessment: Report given to RN and Post -op Vital signs reviewed and stable  Post vital signs: Reviewed and stable  Last Vitals:  Vitals Value Taken Time  BP 127/95 02/21/2018 10:49 AM  Temp    Pulse 51 02/21/2018 10:50 AM  Resp 13 02/21/2018 10:50 AM  SpO2 100 % 02/21/2018 10:50 AM  Vitals shown include unvalidated device data.  Last Pain:  Vitals:   02/21/18 0606  TempSrc: Oral  PainSc:       Patients Stated Pain Goal: 2 (02/20/18 2238)  Complications: No apparent anesthesia complications

## 2018-02-22 ENCOUNTER — Encounter (HOSPITAL_COMMUNITY): Payer: Self-pay | Admitting: Surgery

## 2018-02-22 LAB — PREALBUMIN: Prealbumin: 16 mg/dL — ABNORMAL LOW (ref 18–38)

## 2018-02-22 MED ORDER — KETOROLAC TROMETHAMINE 15 MG/ML IJ SOLN: 15.0000 mg | Freq: Four times a day (QID) | INTRAMUSCULAR | Status: DC | PRN

## 2018-02-22 MED ORDER — IBUPROFEN 800 MG PO TABS: 800.0000 mg | ORAL_TABLET | Freq: Three times a day (TID) | ORAL | Status: DC | PRN

## 2018-02-22 MED ORDER — BOOST / RESOURCE BREEZE PO LIQD CUSTOM
1.0000 | Freq: Three times a day (TID) | ORAL | Status: DC
  Administered 2018-02-22 – 2018-02-23 (×2): 1 via ORAL

## 2018-02-22 NOTE — Progress Notes (Addendum)
1 Day Post-Op    CC: Abdominal wall abscess  Subjective: Patient is had 2 dressing changes so far since his surgery yesterday.  Took down the dressing this morning it measures 2 x 5 x 5.  The drainage on the dressing is serosanguineous.  There is no evidence of feculent appearing fluid or discharge at the base of the wound.  Objective: Vital signs in last 24 hours: Temp:  [97.4 F (36.3 C)-98.1 F (36.7 C)] 98 F (36.7 C) (02/10 0542) Pulse Rate:  [43-52] 47 (02/10 0542) Resp:  [10-16] 14 (02/09 1415) BP: (115-140)/(70-95) 132/88 (02/10 0542) SpO2:  [99 %-100 %] 100 % (02/10 0542) Last BM Date: 02/20/18 240 p.o. 1800 IV 840 urine Afebrile vital signs are stable No labs this a.m. Albumin 16.0 Cultures pending Admission CT 2/8: Shows a partial distal colectomy with end colostomy in the ventral left abdominal wall with a complex chronic left upper quadrant fistula involving the greater curve of the stomach distal transverse colon and the proximal jejunum.  new EC fistula arising from the chronic left upper quadrant fistula tracking to the skin surface just left of the midline in the supraumbilical region complicated by 8 x 5.2 x 5.1 cm gas containing subcutaneous abscess in the midline with thick/enhancing bowel wall. Intake/Output from previous day: 02/09 0701 - 02/10 0700 In: 2080.5 [P.O.:240; I.V.:1540.5; IV Piggyback:300] Out: 850 [Urine:840; Blood:10] Intake/Output this shift: No intake/output data recorded.  General appearance: alert, cooperative and no distress Resp: clear to auscultation bilaterally GI: Soft, sore, 2 x 5 x 5 cm open abdominal wall abscess cavity.  Currently is clean serous drainage at the base.  No evidence of feculent material.  Normal bowel sounds.  Colostomy drainage appears normal.  Lab Results:  Recent Labs    02/20/18 1348 02/21/18 0519  WBC 19.8* 11.9*  HGB 8.9* 7.5*  HCT 34.4* 29.0*  PLT 378 309    BMET Recent Labs    02/20/18 1348  02/21/18 0519  NA 140 138  K 3.5 3.7  CL 102 105  CO2 30 27  GLUCOSE 100* 72  BUN 12 13  CREATININE 0.91 0.74  CALCIUM 9.2 8.2*   PT/INR No results for input(s): LABPROT, INR in the last 72 hours.  Recent Labs  Lab 02/20/18 1348 02/21/18 0519  AST 12* 11*  ALT 14 11  ALKPHOS 42 34*  BILITOT 0.3 0.3  PROT 7.3 6.1*  ALBUMIN 3.1* 2.5*     Lipase     Component Value Date/Time   LIPASE 27 02/20/2018 1348   Prior to Admission medications   Medication Sig Start Date End Date Taking? Authorizing Provider  Adalimumab (HUMIRA) 40 MG/0.4ML PSKT Inject 40 mg into the skin every 14 (fourteen) days.   Yes [provider]  ibuprofen (ADVIL,MOTRIN) 800 MG tablet Take 800 mg by mouth every 8 (eight) hours as needed for moderate pain.   Yes [provider]  predniSONE (DELTASONE) 10 MG tablet Take 40 mg by mouth daily with breakfast.    Yes [provider]  dicyclomine (BENTYL) 20 MG tablet Take 1 tablet (20 mg total) by mouth 2 (two) times daily. Patient not taking: Reported on 12/05/2017 10/08/17   Joline Maxcy A, PA-C  moxifloxacin (VIGAMOX) 0.5 % ophthalmic solution Every 4 hours until seen by opthalmology Patient not taking: Reported on 02/20/2018 12/05/17   Ok Edwards, PA-C  Polyethyl Glycol-Propyl Glycol (SYSTANE) 0.4-0.3 % GEL ophthalmic gel Place 1 application into both eyes at bedtime as  needed. Patient not taking: Reported on 02/20/2018 12/05/17   Ok Edwards, PA-C      Medications: . heparin  5,000 Units Subcutaneous Q8H   . sodium chloride 100 mL/hr at 02/21/18 2344  . meropenem (MERREM) IV 1 g (02/22/18 0525)   Anti-infectives (From admission, onward)   Start     Dose/Rate Route Frequency Ordered Stop   02/20/18 2245  meropenem (MERREM) 1 g in sodium chloride 0.9 % 100 mL IVPB     1 g 200 mL/hr over 30 Minutes Intravenous Every 8 hours 02/20/18 2232       Assessment/Plan Hx DVT Anemia Moderate malnutrition -prealbumin 16   Abdominal wall  abscess/possible EC fistula Hx partial colectomy/colostomy - complex chronic fistula involving stomach, distal transverse colon and proximal jejunum Hx Crohn's disease -on prednisone/Humira Incision and debridement of abdominal wall abscess 02/21/2018, Dr. Alphonsa Overall   FEN: IV fluids/clear liquids ID: Meropenem 2/8 >> day 3 DVT:  Heparin Follow up:  TBD/Dr. Pamalee Leyden GI   Plan: He is on 3 times daily dressing changes.  Would continue those in order to provide close observation while he is in the hospital. Gram stain shows abundant gram-positive cocci and WBCs.  I would continue the current treatment until we get the cultures back.  Recommend follow-up in hospital with [facility], he had Humira about a week ago.  I would have the nursing staff notify us if there is any indication of feculent drainage, at the base of the drainage site.  Bring back on a soft diet with dietitian consult to help with his moderate malnutrition. He has daily labs ordered but none so far today will watch and see how these trend.  LOS: 2 days    Kaliyan Osbourn 02/22/2018 [phone]

## 2018-02-22 NOTE — Progress Notes (Signed)
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  Patient was taken for I&D on 2/9 by general surgery.  Gastroenterology was consulted.   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  General surgery following.  Continue local wound care -Gastroenterology following, hold off on prednisone.  We will follow-up outpatient with gastroenterology-likely [facility] health system due to his insurance reasons. -Advance diet as tolerated, pain control - Follow-up culture data -IV fluids as needed -Continue IV meropenem for now.  Holding off on steroids.  Once infection is improved, he will need to be started on further regimen.  Anemia of chronic disease -Closely follow hemoglobin.  DVT prophylaxis: Subcu heparin Code Status: Full code Family Communication: None Disposition Plan: Maintain inpatient stay for IV antibiotics, frequent wound care dressing.  Once culture data has resulted, will make further plans.  Consultants:   Gastroenterology  General surgery  Procedures:   I&D of the abdominal wall  Antimicrobials:   Meropenem day 3   Subjective: Feels little better.  Feels like his dressing/drainage from the abdominal site is slowly improving.  Review of Systems Otherwise negative except as per HPI,  including: General = no fevers, chills, dizziness, malaise, fatigue HEENT/EYES = negative for pain, redness, loss of vision, double vision, blurred vision, loss of hearing, sore throat, hoarseness, dysphagia Cardiovascular= negative for chest pain, palpitation, murmurs, lower extremity swelling Respiratory/lungs= negative for shortness of breath, cough, hemoptysis, wheezing, mucus production Gastrointestinal= negative for nausea, vomiting,, abdominal pain, melena, hematemesis Genitourinary= negative for Dysuria, Hematuria, Change in Urinary Frequency MSK = Negative for arthralgia, myalgias, Back Pain, Joint swelling  Neurology= Negative for headache, seizures, numbness, tingling  Psychiatry= Negative for anxiety, depression, suicidal and homocidal ideation Allergy/Immunology= Medication/Food allergy as listed  Skin= Negative for Rash, lesions, ulcers, itching   Objective: Vitals:   02/21/18 1152 02/21/18 1415 02/21/18 1945 02/22/18 0542  BP: 122/83 115/70 124/78 132/88  Pulse: (!) 51 (!) 50 (!) 52 (!) 47  Resp: 15 14    Temp: 97.7 F (36.5 C) 97.7 F (36.5 C) 98.1 F (36.7 C) 98 F (36.7 C)  TempSrc: Oral Oral Oral Oral  SpO2: 100% 100% 99% 100%  Weight:      Height:        Intake/Output Summary (Last 24 hours) at 02/22/2018 1048 Last data filed at 02/22/2018 6579 Gross per 24 hour  Intake 1880.52 ml  Output 1850 ml  Net 30.52 ml   Filed Weights   02/20/18 1343  Weight: 81.6 kg    Examination:  Constitutional: NAD, calm, comfortable Eyes: PERRL, lids and conjunctivae normal ENMT: Mucous membranes are moist. Posterior pharynx clear of any exudate or lesions.Normal dentition.  Neck: normal, supple, no masses, no thyromegaly Respiratory: clear to auscultation bilaterally, no wheezing, no crackles. Normal respiratory effort. No accessory muscle use.  Cardiovascular: Regular rate and rhythm, no murmurs /  rubs / gallops. No extremity edema. 2+ pedal pulses. No carotid  bruits.  Abdomen: Abdominal dressing noted around the surgical site.  Appears dry and intact.  Colostomy bag noted. Musculoskeletal: no clubbing / cyanosis. No joint deformity upper and lower extremities. Good ROM, no contractures. Normal muscle tone.  Skin: no rashes, lesions, ulcers. No induration Neurologic: CN 2-12 grossly intact. Sensation intact, DTR normal. Strength 5/5 in all 4.  Psychiatric: Normal judgment and insight. Alert and oriented x 3. Normal mood.   Data Reviewed:   CBC: Recent Labs  Lab 02/20/18 1348 02/21/18 0519  WBC 19.8* 11.9*  HGB 8.9* 7.5*  HCT 34.4* 29.0*  MCV 67.2* 67.6*  PLT 378 154   Basic Metabolic Panel: Recent Labs  Lab 02/20/18 1348 02/21/18 0519  NA 140 138  K 3.5 3.7  CL 102 105  CO2 30 27  GLUCOSE 100* 72  BUN 12 13  CREATININE 0.91 0.74  CALCIUM 9.2 8.2*   GFR: Estimated Creatinine Clearance: 157.3 mL/min (by C-G formula based on SCr of 0.74 mg/dL). Liver Function Tests: Recent Labs  Lab 02/20/18 1348 02/21/18 0519  AST 12* 11*  ALT 14 11  ALKPHOS 42 34*  BILITOT 0.3 0.3  PROT 7.3 6.1*  ALBUMIN 3.1* 2.5*   Recent Labs  Lab 02/20/18 1348  LIPASE 27   No results for input(s): AMMONIA in the last 168 hours. Coagulation Profile: No results for input(s): INR, PROTIME in the last 168 hours. Cardiac Enzymes: No results for input(s): CKTOTAL, CKMB, CKMBINDEX, TROPONINI in the last 168 hours. BNP (last 3 results) No results for input(s): PROBNP in the last 8760 hours. HbA1C: No results for input(s): HGBA1C in the last 72 hours. CBG: No results for input(s): GLUCAP in the last 168 hours. Lipid Profile: No results for input(s): CHOL, HDL, LDLCALC, TRIG, CHOLHDL, LDLDIRECT in the last 72 hours. Thyroid Function Tests: No results for input(s): TSH, T4TOTAL, FREET4, T3FREE, THYROIDAB in the last 72 hours. Anemia Panel: No results for input(s): VITAMINB12, FOLATE, FERRITIN, TIBC, IRON, RETICCTPCT in the last 72 hours. Sepsis  Labs: No results for input(s): PROCALCITON, LATICACIDVEN in the last 168 hours.  Recent Results (from the past 240 hour(s))  Surgical pcr screen     Status: None   Collection Time: 02/21/18  7:23 AM  Result Value Ref Range Status   MRSA, PCR NEGATIVE NEGATIVE Final   Staphylococcus aureus NEGATIVE NEGATIVE Final    Comment: (NOTE) The Xpert SA Assay (FDA approved for NASAL specimens in patients 78 years of age and older), is one component of a comprehensive surveillance program. It is not intended to diagnose infection nor to guide or monitor treatment. Performed at [facility], [place] [address] [place], [place] [address]   Aerobic/Anaerobic Culture (surgical/deep wound)     Status: None (Preliminary result)   Collection Time: 02/21/18 10:18 AM  Result Value Ref Range Status   Specimen Description   Final    ABSCESS ABDOMEN Performed at [facility], [place] [address], [place], [place] [address]    Special Requests   Final    NONE Performed at [facility], [place] [address] [place], [place] [address]    Gram Stain   Final    ABUNDANT WBC PRESENT, PREDOMINANTLY PMN ABUNDANT GRAM POSITIVE COCCI    Culture PENDING  Incomplete   Report Status PENDING  Incomplete         Radiology Studies: Ct Abdomen Pelvis W Contrast  Result Date:  02/20/2018 CLINICAL DATA:  Crohn disease status post partial colectomy with end colostomy. New swelling and foul-smelling drainage adjacent to the colostomy. EXAM: CT ABDOMEN AND PELVIS WITH CONTRAST TECHNIQUE: Multidetector CT imaging of the abdomen and pelvis was performed using the standard protocol following bolus administration of intravenous contrast. CONTRAST:  134m ISOVUE-300 IOPAMIDOL (ISOVUE-300) INJECTION 61% COMPARISON:  08/09/2017 CT abdomen/pelvis. FINDINGS: Lower chest: No significant pulmonary nodules or acute consolidative airspace disease. Hepatobiliary: Normal liver size. No liver mass. Normal  gallbladder with no radiopaque cholelithiasis. No biliary ductal dilatation. Pancreas: Normal, with no mass or duct dilation. Spleen: Normal size. No mass. Adrenals/Urinary Tract: Normal adrenals. Normal kidneys with no hydronephrosis and no renal mass. Normal bladder. Stomach/Bowel: Patient is status post partial distal colectomy with end colostomy in the ventral left abdominal wall. There is a complex chronic left upper quadrant fistula involving the greater curvature of the body of the stomach, the distal transverse colon, and the proximal jejunum. 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 No small bowel dilatation or new sites of small bowel wall thickening. Appendix not discretely visualized. Collapsed normal rectal remnant. Vascular/Lymphatic: Normal caliber abdominal aorta. Patent portal, splenic, hepatic and renal veins. No pathologically enlarged lymph nodes in the abdomen or pelvis. Reproductive: Top-normal size prostate. Other: No pneumoperitoneum.  No ascites. Musculoskeletal: No aggressive appearing focal osseous lesions. IMPRESSION: 1. 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 2. The complex chronic left upper quadrant fistula involves the greater curvature of the body of the stomach, the proximal jejunum and the distal transverse colon. 3. No evidence of bowel obstruction. Electronically Signed   By: JIlona SorrelM.D.   On: 02/20/2018 20:15        Scheduled Meds: . heparin  5,000 Units Subcutaneous Q8H    Continuous Infusions: . sodium chloride 100 mL/hr at 02/21/18 2344  . meropenem (MERREM) IV 1 g (02/22/18 0525)     LOS: 2 days   Time spent= 35 mins     CArsenio Loader MD [facility]  If 7PM-7AM, please contact night-coverage [email or web address] 02/22/2018, 10:48 AM

## 2018-02-22 NOTE — Progress Notes (Addendum)
Initial Nutrition Assessment  DOCUMENTATION CODES:   Non-severe (moderate) malnutrition in context of chronic illness  INTERVENTION:   -Provide Boost Breeze po TID, each supplement provides 250 kcal and 9 grams of protein -Reviewed plant-based protein options with patient per request  NUTRITION DIAGNOSIS:   Moderate Malnutrition related to chronic illness(Crohn's disease) as evidenced by mild muscle depletion, energy intake < or equal to 75% for > or equal to 1 month.  GOAL:   Patient will meet greater than or equal to 90% of their needs  MONITOR:   PO intake, Supplement acceptance, Labs, Weight trends, I & O's  REASON FOR ASSESSMENT:   Consult Assessment of nutrition requirement/status  ASSESSMENT:   28 year old with history of complicated Crohn's disease status post colostomy in 2008 with recurrent flare, malnutrition, history of DVT on prednisone and Humira came to the hospital with complains of worsening abdominal pain and some drainage around the site of colostomy.  2/9: s/p debridement of abdominal wall abscess  Patient reports feeling better and very hungry. Pt was waiting on lunch tray (his first solid meal) since his surgery. Pt states he has been eating but the pain from his abscess has preventing him from eating as much as he used to. Pt would become full quickly.   Pt with many diet related questions. Wanted to know some non-dairy protein options so he can increase his protein consumption without increasing dairy or meat consumption.  Pt states he does tolerate Boost Breeze, RD to order.  Per patient, pt lost 100 lbs between 2018-2019. Since that time his weight did drop to its lowest of 165 lb. Now his weight is back up to 180 lb. Pt states he would like to gain another 10-15 lb back.   Labs reviewed. Medications reviewed.  NUTRITION - FOCUSED PHYSICAL EXAM:    Most Recent Value  Orbital Region  No depletion  Upper Arm Region  No depletion  Thoracic and  Lumbar Region  Unable to assess  Buccal Region  No depletion  Temple Region  Mild depletion  Clavicle Bone Region  Mild depletion  Clavicle and Acromion Bone Region  Mild depletion  Scapular Bone Region  No depletion  Dorsal Hand  No depletion  Patellar Region  No depletion  Anterior Thigh Region  No depletion  Posterior Calf Region  No depletion  Edema (RD Assessment)  None       Diet Order:   Diet Order            DIET SOFT Room service appropriate? Yes; Fluid consistency: Thin  Diet effective now              EDUCATION NEEDS:   Education needs have been addressed  Skin:  Skin Assessment: Reviewed RN Assessment  Last BM:  2/8  Height:   Ht Readings from Last 1 Encounters:  02/20/18 6\' 2"  (1.88 m)    Weight:   Wt Readings from Last 1 Encounters:  02/20/18 81.6 kg    Ideal Body Weight:  86.3 kg  BMI:  Body mass index is 23.11 kg/m.  Estimated Nutritional Needs:   Kcal:  2050-2250  Protein:  85-95g  Fluid:  2L/day   Tilda Franco, MS, RD, LDN [facility] Inpatient Clinical Dietitian Pager: [phone] After Hours Pager: [phone]

## 2018-02-22 NOTE — Progress Notes (Signed)
[facility] PROGRESS NOTE Subjective Patient feeling fairly well after his abscess drain.  Unfortunately, his insurance does not cover any GI doctors in [place] specifically [facility] or others.  He will need to see someone in the [facility] network.  Objective: Vital signs in last 24 hours: Temp:  [97.4 F (36.3 C)-98.1 F (36.7 C)] 98 F (36.7 C) (02/10 0542) Pulse Rate:  [43-52] 47 (02/10 0542) Resp:  [10-16] 14 (02/09 1415) BP: (115-140)/(70-95) 132/88 (02/10 0542) SpO2:  [99 %-100 %] 100 % (02/10 0542) Last BM Date: 02/20/18  Intake/Output from previous day: 02/09 0701 - 02/10 0700 In: 2080.5 [P.O.:240; I.V.:1540.5; IV Piggyback:300] Out: 850 [Urine:840; Blood:10] Intake/Output this shift: Total I/O In: -  Out: 1000 [Urine:1000]  Lab Results: Recent Labs    02/20/18 1348 02/21/18 0519  WBC 19.8* 11.9*  HGB 8.9* 7.5*  HCT 34.4* 29.0*  PLT 378 309   BMET Recent Labs    02/20/18 1348 02/21/18 0519  NA 140 138  K 3.5 3.7  CL 102 105  CO2 30 27  CREATININE 0.91 0.74   LFT Recent Labs    02/20/18 1348 02/21/18 0519  PROT 7.3 6.1*  AST 12* 11*  ALT 14 11  ALKPHOS 42 34*  BILITOT 0.3 0.3   PT/INR No results for input(s): LABPROT, INR in the last 72 hours. PANCREAS Recent Labs    02/20/18 1348  LIPASE 27         Studies/Results: Ct Abdomen Pelvis W Contrast  Result Date: 02/20/2018 CLINICAL DATA:  Crohn disease status post partial colectomy with end colostomy. New swelling and foul-smelling drainage adjacent to the colostomy. EXAM: CT ABDOMEN AND PELVIS WITH CONTRAST TECHNIQUE: Multidetector CT imaging of the abdomen and pelvis was performed using the standard protocol following bolus administration of intravenous contrast. CONTRAST:  ISOVUE-300 IOPAMIDOL (ISOVUE-300) INJECTION 61% COMPARISON:  08/09/2017 CT abdomen/pelvis. FINDINGS: Lower chest: No significant pulmonary nodules or acute consolidative airspace disease.  Hepatobiliary: Normal liver size. No liver mass. Normal gallbladder with no radiopaque cholelithiasis. No biliary ductal dilatation. Pancreas: Normal, with no mass or duct dilation. Spleen: Normal size. No mass. Adrenals/Urinary Tract: Normal adrenals. Normal kidneys with no hydronephrosis and no renal mass. Normal bladder. Stomach/Bowel: Patient is status post partial distal colectomy with end colostomy in the ventral left abdominal wall. There is a complex chronic left upper quadrant fistula involving the greater curvature of the body of the stomach, the distal transverse colon, and the proximal jejunum. 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 No small bowel dilatation or new sites of small bowel wall thickening. Appendix not discretely visualized. Collapsed normal rectal remnant. Vascular/Lymphatic: Normal caliber abdominal aorta. Patent portal, splenic, hepatic and renal veins. No pathologically enlarged lymph nodes in the abdomen or pelvis. Reproductive: Top-normal size prostate. Other: No pneumoperitoneum.  No ascites. Musculoskeletal: No aggressive appearing focal osseous lesions. IMPRESSION: 1. 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 2. The complex chronic left upper quadrant fistula involves the greater curvature of the body of the stomach, the proximal jejunum and the distal transverse colon.  3. No evidence of bowel obstruction. Electronically Signed   By: Delbert Phenix M.D.   On: 02/20/2018 20:15    Medications: I  have reviewed the patient's current medications.  Assessment:   1.  Abdominal wall abscess.  Status post drainage yesterday 2.  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  History of partial colectomy with permanent colostomy performed approximately 12 to 14 years ago in [place]   Plan: Would continue with antibiotics.  He has had Humira recently.  Would hold off on steroids until we are sure that his infection has cleared up.  I discussed with him the fact that he will need to see GI doctors in the [facility] system due to his insurance.  I suggested that he call and make an appointment with Dr. Raeanne [place], who is an IBD specialist at [facility].  The patient indicated he will do this.  We will continue to follow him here in the hospital.   Tresea Mall 02/22/2018, 9:16 AM  This note was created using voice recognition software. Minor errors may Have occurred unintentionally.  Pager: [phone] If no answer or after hours call [phone]

## 2018-02-23 DIAGNOSIS — E440 Moderate protein-calorie malnutrition: Secondary | ICD-10-CM

## 2018-02-23 LAB — CBC
HCT: 29.6 % — ABNORMAL LOW (ref 39.0–52.0)
Hemoglobin: 7.8 g/dL — ABNORMAL LOW (ref 13.0–17.0)
MCH: 17.5 pg — ABNORMAL LOW (ref 26.0–34.0)
MCHC: 26.4 g/dL — AB (ref 30.0–36.0)
MCV: 66.5 fL — ABNORMAL LOW (ref 80.0–100.0)
PLATELETS: 306 10*3/uL (ref 150–400)
RBC: 4.45 MIL/uL (ref 4.22–5.81)
RDW: 22.7 % — ABNORMAL HIGH (ref 11.5–15.5)
WBC: 13.9 10*3/uL — ABNORMAL HIGH (ref 4.0–10.5)
nRBC: 0.1 % (ref 0.0–0.2)

## 2018-02-23 LAB — COMPREHENSIVE METABOLIC PANEL
ALT: 9 U/L (ref 0–44)
AST: 9 U/L — ABNORMAL LOW (ref 15–41)
Albumin: 2.3 g/dL — ABNORMAL LOW (ref 3.5–5.0)
Alkaline Phosphatase: 33 U/L — ABNORMAL LOW (ref 38–126)
Anion gap: 9 (ref 5–15)
BUN: 9 mg/dL (ref 6–20)
CALCIUM: 8.1 mg/dL — AB (ref 8.9–10.3)
CHLORIDE: 102 mmol/L (ref 98–111)
CO2: 26 mmol/L (ref 22–32)
Creatinine, Ser: 0.86 mg/dL (ref 0.61–1.24)
GFR calc Af Amer: >60 mL/min (ref >60)
GFR calc non Af Amer: >60 mL/min (ref >60)
Glucose, Bld: 78 mg/dL (ref 70–99)
Potassium: 3.5 mmol/L (ref 3.5–5.1)
Sodium: 137 mmol/L (ref 135–145)
Total Bilirubin: 0.4 mg/dL (ref 0.3–1.2)
Total Protein: 5.5 g/dL — ABNORMAL LOW (ref 6.5–8.1)

## 2018-02-23 LAB — MAGNESIUM: Magnesium: 1.9 mg/dL (ref 1.7–2.4)

## 2018-02-23 MED ORDER — SACCHAROMYCES BOULARDII 250 MG PO CAPS
250.0000 mg | ORAL_CAPSULE | Freq: Two times a day (BID) | ORAL | Status: DC
  Administered 2018-02-23 – 2018-02-24 (×3): 250 mg via ORAL
  Filled 2018-02-23 (×3): qty 1

## 2018-02-23 NOTE — Progress Notes (Signed)
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  Patient was taken for I&D on 2/9 by general surgery.  Gastroenterology was consulted.   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  General surgery following.  Continue local wound care -Gastroenterology following, hold off on prednisone.  We will follow-up outpatient with gastroenterology-likely [facility] health system due to his insurance reasons. -Advance diet as tolerated, pain control - Follow up final culture data.  -IV fluids as needed -Cont IV meropenem for one more day. Hopefully will have more cx data tomorrow and we can transition to PO and discharge him.   Anemia of chronic disease -Closely follow hemoglobin.  DVT prophylaxis: Subcu heparin Code Status: Full code Family Communication: None Disposition Plan: Maintain in hosp stay until more Cx data. Likely discharge tomorrow.   Consultants:   Gastroenterology  General surgery  Procedures:   I&D of the abdominal wall  Antimicrobials:   Meropenem day 4   Subjective: Feels better no complaints. Feels the drainage is imprving.   Review of Systems Otherwise negative except as per HPI, including: General = no fevers, chills, dizziness, malaise,  fatigue HEENT/EYES = negative for pain, redness, loss of vision, double vision, blurred vision, loss of hearing, sore throat, hoarseness, dysphagia Cardiovascular= negative for chest pain, palpitation, murmurs, lower extremity swelling Respiratory/lungs= negative for shortness of breath, cough, hemoptysis, wheezing, mucus production Gastrointestinal= negative for nausea, vomiting,, abdominal pain, melena, hematemesis Genitourinary= negative for Dysuria, Hematuria, Change in Urinary Frequency MSK = Negative for arthralgia, myalgias, Back Pain, Joint swelling  Neurology= Negative for headache, seizures, numbness, tingling  Psychiatry= Negative for anxiety, depression, suicidal and homocidal ideation Allergy/Immunology= Medication/Food allergy as listed  Skin= Negative for Rash, lesions, ulcers, itching    Objective: Vitals:   02/22/18 0542 02/22/18 1358 02/22/18 2101 02/23/18 0548  BP: 132/88 112/72 117/69 115/73  Pulse: (!) 47 68 71 62  Resp:  18 18 16   Temp: 98 F (36.7 C) 98.9 F (37.2 C) 98.4 F (36.9 C) 98.1 F (36.7 C)  TempSrc: Oral Oral Oral Oral  SpO2: 100% 100% 100% 100%  Weight:      Height:        Intake/Output Summary (Last 24 hours) at 02/23/2018 1144 Last data filed at 02/23/2018 1058 Gross per 24 hour  Intake 1200 ml  Output 3000 ml  Net -1800 ml   Filed Weights   02/20/18 1343  Weight: 81.6 kg    Examination:  Constitutional: NAD, calm, comfortable Eyes: PERRL, lids and conjunctivae normal ENMT: Mucous membranes are moist. Posterior pharynx clear of any exudate or lesions.Normal dentition.  Neck: normal, supple, no masses, no thyromegaly Respiratory: clear to auscultation bilaterally, no wheezing, no crackles. Normal respiratory effort. No accessory muscle use.  Cardiovascular: Regular rate and rhythm, no murmurs / rubs / gallops. No extremity  edema. 2+ pedal pulses. No carotid bruits.  Abdomen: no tenderness, no masses palpated. No hepatosplenomegaly.  Bowel sounds positive.  Musculoskeletal: no clubbing / cyanosis. No joint deformity upper and lower extremities. Good ROM, no contractures. Normal muscle tone.  Skin: abd dressing appears to be dry and intact.  Neurologic: CN 2-12 grossly intact. Sensation intact, DTR normal. Strength 5/5 in all 4.  Psychiatric: Normal judgment and insight. Alert and oriented x 3. Normal mood.    Data Reviewed:   CBC: Recent Labs  Lab 02/20/18 1348 02/21/18 0519 02/23/18 0522  WBC 19.8* 11.9* 13.9*  HGB 8.9* 7.5* 7.8*  HCT 34.4* 29.0* 29.6*  MCV 67.2* 67.6* 66.5*  PLT 378 309 540   Basic Metabolic Panel: Recent Labs  Lab 02/20/18 1348 02/21/18 0519 02/23/18 0522  NA 140 138 137  K 3.5 3.7 3.5  CL 102 105 102  CO2 30 27 26   GLUCOSE 100* 72 78  BUN 12 13 9   CREATININE 0.91 0.74 0.86  CALCIUM 9.2 8.2* 8.1*  MG  --   --  1.9   GFR: Estimated Creatinine Clearance: 146.3 mL/min (by C-G formula based on SCr of 0.86 mg/dL). Liver Function Tests: Recent Labs  Lab 02/20/18 1348 02/21/18 0519 02/23/18 0522  AST 12* 11* 9*  ALT 14 11 9   ALKPHOS 42 34* 33*  BILITOT 0.3 0.3 0.4  PROT 7.3 6.1* 5.5*  ALBUMIN 3.1* 2.5* 2.3*   Recent Labs  Lab 02/20/18 1348  LIPASE 27   No results for input(s): AMMONIA in the last 168 hours. Coagulation Profile: No results for input(s): INR, PROTIME in the last 168 hours. Cardiac Enzymes: No results for input(s): CKTOTAL, CKMB, CKMBINDEX, TROPONINI in the last 168 hours. BNP (last 3 results) No results for input(s): PROBNP in the last 8760 hours. HbA1C: No results for input(s): HGBA1C in the last 72 hours. CBG: No results for input(s): GLUCAP in the last 168 hours. Lipid Profile: No results for input(s): CHOL, HDL, LDLCALC, TRIG, CHOLHDL, LDLDIRECT in the last 72 hours. Thyroid Function Tests: No results for input(s): TSH, T4TOTAL, FREET4, T3FREE, THYROIDAB in the last 72 hours. Anemia Panel: No results for input(s): VITAMINB12, FOLATE,  FERRITIN, TIBC, IRON, RETICCTPCT in the last 72 hours. Sepsis Labs: No results for input(s): PROCALCITON, LATICACIDVEN in the last 168 hours.  Recent Results (from the past 240 hour(s))  Surgical pcr screen     Status: None   Collection Time: 02/21/18  7:23 AM  Result Value Ref Range Status   MRSA, PCR NEGATIVE NEGATIVE Final   Staphylococcus aureus NEGATIVE NEGATIVE Final    Comment: (NOTE) The Xpert SA Assay (FDA approved for NASAL specimens in patients 55 years of age and older), is one component of a comprehensive surveillance program. It is not intended to diagnose infection nor to guide or monitor treatment. Performed at [facility], [place] [address], [place], [place] [address]   Aerobic/Anaerobic Culture (surgical/deep wound)     Status: None (Preliminary result)   Collection Time: 02/21/18 10:18 AM  Result Value Ref Range Status   Specimen Description   Final    ABSCESS ABDOMEN Performed at [facility], [place] [address], [place], [place] [address]    Special Requests   Final    NONE Performed at [facility], [place] [address], [place], [place] [address]    Gram Stain   Final    ABUNDANT WBC PRESENT, PREDOMINANTLY PMN ABUNDANT GRAM POSITIVE COCCI    Culture  FEW ESCHERICHIA COLI  Final   Report Status PENDING  Incomplete         Radiology Studies: No results found.      Scheduled Meds: . feeding supplement  1 Container Oral TID BM  . heparin  5,000 Units Subcutaneous Q8H  . saccharomyces boulardii  250 mg Oral BID   Continuous Infusions: . sodium chloride 100 mL/hr at 02/21/18 2344  . meropenem (MERREM) IV 1 g (02/23/18 0629)     LOS: 3 days   Time spent= 25 mins    Cindie Rajagopalan Arsenio Loader, MD [facility]  If 7PM-7AM, please contact night-coverage [email or web address] 02/23/2018, 11:44 AM

## 2018-02-23 NOTE — Discharge Instructions (Signed)
Skin Abscess  A skin abscess is an infected area of your skin that contains pus and other material. An abscess can happen in any part of your body. Some abscesses break open (rupture) on their own. Most continue to get worse unless they are treated. The infection can spread deeper into the body and into your blood, which can make you feel sick. A skin abscess is caused by germs that enter the skin through a cut or scrape. It can also be caused by blocked oil and sweat glands or infected hair follicles. This condition is usually treated by:  Draining the pus.  Taking antibiotic medicines.  Placing a warm, wet washcloth over the abscess. Follow these instructions at home: Medicines   Take over-the-counter and prescription medicines only as told by your doctor.  If you were prescribed an antibiotic medicine, take it as told by your doctor. Do not stop taking the antibiotic even if you start to feel better. Abscess care   If you have an abscess that has not drained, place a warm, clean, wet washcloth over the abscess several times a day. Do this as told by your doctor.  Follow instructions from your doctor about how to take care of your abscess. Make sure you: ? Cover the abscess with a bandage (dressing). ? Change your bandage or gauze as told by your doctor. ? Wash your hands with soap and water before you change the bandage or gauze. If you cannot use soap and water, use hand sanitizer.  Check your abscess every day for signs that the infection is getting worse. Check for: ? More redness, swelling, or pain. ? More fluid or blood. ? Warmth. ? More pus or a bad smell. General instructions  To avoid spreading the infection: ? Do not share personal care items, towels, or hot tubs with others. ? Avoid making skin-to-skin contact with other people.  Keep all follow-up visits as told by your doctor. This is important. Contact a doctor if:  You have more redness, swelling, or pain  around your abscess.  You have more fluid or blood coming from your abscess.  Your abscess feels warm when you touch it.  You have more pus or a bad smell coming from your abscess.  You have a fever.  Your muscles ache.  You have chills.  You feel sick. Get help right away if:  You have very bad (severe) pain.  You see red streaks on your skin spreading away from the abscess. Summary  A skin abscess is an infected area of your skin that contains pus and other material.  The abscess is caused by germs that enter the skin through a cut or scrape. It can also be caused by blocked oil and sweat glands or infected hair follicles.  Follow your doctor's instructions on caring for your abscess, taking medicines, preventing infections, and keeping follow-up visits. This information is not intended to replace advice given to you by your health care provider. Make sure you discuss any questions you have with your health care provider. Document Released: 06/18/2007 Document Revised: 02/12/2017 Document Reviewed: 02/12/2017 Elsevier Interactive Patient Education  2019 [employer] Inc[employer]   Mechanical Wound Debridement Mechanical wound debridement is a treatment to remove dead tissue from a wound. This helps the wound heal. The treatment involves cleaning the wound (irrigation) and using a pad or gauze (dressing) to remove dead tissue and debris from the wound. There are different types of mechanical wound debridement. Depending on the wound, you  may need to repeat this procedure or change to another form of debridement as your wound starts to heal. Tell a health care provider about:  Any allergies you have.  All medicines you are taking, including vitamins, herbs, eye drops, creams, and over-the-counter medicines.  Any blood disorders you have.  Any medical conditions you have, including any conditions that: ? Cause a significant decrease in blood circulation to the part of the body where  the wound is, such as peripheral vascular disease. ? Compromise your defense (immune) system or white blood count.  Any surgeries you have had.  Whether you are pregnant or may be pregnant. What are the risks? Generally, this is a safe procedure. However, problems may occur, including:  Infection.  Bleeding.  Damage to healthy tissue in and around your wound.  Soreness or pain.  Failure of the wound to heal.  Scarring. What happens before the procedure? You may be given antibiotic medicine to help prevent infection. What happens during the procedure?  Your health care provider may apply a numbing medicine (topical anesthetic) to the wound.  Your health care provider will irrigate your wound with a germ-free (sterile), salt-water (saline) solution. This removes debris, bacteria, and dead tissue.  Depending on what type of mechanical wound debridement you are having, your health care provider may do one of the following: ? Put a dressing on your wound. You may have dry gauze pad placed into the wound. Your health care provider will remove the gauze after the wound is dry. Any dead tissue and debris that has dried into the gauze will be lifted out of the wound (wet-to-dry debridement). ? Use a type of pad (monofilament fiber debridement pad). This pad has a fluffy surface on one side that picks up dead tissue and debris from your wound. Your health care provider wets the pad and wipes it over your wound for several minutes. ? Irrigate your wound with a pressurized stream of solution such as saline or water.  Once your health care provider is finished, he or she may apply a light dressing to your wound. The procedure may vary among health care providers and hospitals. What happens after the procedure?  You may receive medicine for pain.  You will continue to receive antibiotic medicine if it was started before your procedure. This information is not intended to replace advice  given to you by your health care provider. Make sure you discuss any questions you have with your health care provider. Document Released: 09/20/2014 Document Revised: 06/07/2015 Document Reviewed: 05/10/2014 Elsevier Interactive Patient Education  2019 [employer].

## 2018-02-23 NOTE — Progress Notes (Signed)
Patient is not in room.  He has made an appointment for follow-up with Dr. Kinnie Scales here in town.  Dr. Kinnie Scales is on his insurance plan in [facility] is not in network.  We will still check on him here in the hospital and I would have him follow-up with Dr. Kinnie Scales after discharge.

## 2018-02-23 NOTE — Progress Notes (Signed)
Pharmacy Antibiotic Note  Tom Ferguson is a 28 y.o. male with abdominal pain admitted on 02/20/2018 with intra-abdominal infection.  Pharmacy has been consulted for meropenem dosing. 02/23/2018  WBC 13.9, remains elevated but down from admission. AF. Abscess cx growing few E coli - sensitivities pending  Plan: Meropenem 1 Gm IV q8h F/u sensitivities to E coli to switch to PO abx  Height: 6\' 2"  (188 cm) Weight: 180 lb (81.6 kg) IBW/kg (Calculated) : 82.2  Temp (24hrs), Avg:98.5 F (36.9 C), Min:98.1 F (36.7 C), Max:98.9 F (37.2 C)  Recent Labs  Lab 02/20/18 1348 02/21/18 0519 02/23/18 0522  WBC 19.8* 11.9* 13.9*  CREATININE 0.91 0.74 0.86    Estimated Creatinine Clearance: 146.3 mL/min (by C-G formula based on SCr of 0.86 mg/dL).    No Known Allergies  Antimicrobials this admission: 2/8 meropenem >>   Dose adjustments this admission:  Microbiology results: 2/9  Abscess abdomen> few E coli>> 2/9 MRSA PCR neg/MSSA PCR neg   Thank you for allowing pharmacy to be a part of this patient's care.  Herby Abraham, Pharm.D [phone] 02/23/2018 10:12 AM

## 2018-02-23 NOTE — Progress Notes (Signed)
Dressing change teaching provided by RN. Pt verbalized understanding through teach back and demonstration. Will pass onto to oncoming shift to have patient change dressing as well.

## 2018-02-23 NOTE — Progress Notes (Signed)
2 Days Post-Op    CC: Abdominal wall abscess  Subjective: He is doing pretty well this a.m.  Site still pretty tender, I have a picture below.  Still no feculent drainage.  He has a follow-up with Dr. Earlean Shawl in March.  Objective: Vital signs in last 24 hours: Temp:  [98.1 F (36.7 C)-98.9 F (37.2 C)] 98.1 F (36.7 C) (02/11 0548) Pulse Rate:  [62-71] 62 (02/11 0548) Resp:  [16-18] 16 (02/11 0548) BP: (112-117)/(69-73) 115/73 (02/11 0548) SpO2:  [100 %] 100 % (02/11 0548) Last BM Date: 02/23/18 720 PO 1600 urine Afebrile vital signs are stable WBC still slightly elevated at 13.9, H/H7 0.8/29.6 Intake/Output from previous day: 02/10 0701 - 02/11 0700 In: 720 [P.O.:720] Out: 1600 [Urine:1600] Intake/Output this shift: No intake/output data recorded.  General appearance: alert, cooperative and no distress GI: Pictures below shows wounds clean and pink.  There is no feculent material noted at the base or on the dressing.    Lab Results:  Recent Labs    02/21/18 0519 02/23/18 0522  WBC 11.9* 13.9*  HGB 7.5* 7.8*  HCT 29.0* 29.6*  PLT 309 306    BMET Recent Labs    02/21/18 0519 02/23/18 0522  NA 138 137  K 3.7 3.5  CL 105 102  CO2 27 26  GLUCOSE 72 78  BUN 13 9  CREATININE 0.74 0.86  CALCIUM 8.2* 8.1*   PT/INR No results for input(s): LABPROT, INR in the last 72 hours.  Recent Labs  Lab 02/20/18 1348 02/21/18 0519 02/23/18 0522  AST 12* 11* 9*  ALT 14 11 9   ALKPHOS 42 34* 33*  BILITOT 0.3 0.3 0.4  PROT 7.3 6.1* 5.5*  ALBUMIN 3.1* 2.5* 2.3*     Lipase     Component Value Date/Time   LIPASE 27 02/20/2018 1348     Medications: . feeding supplement  1 Container Oral TID BM  . heparin  5,000 Units Subcutaneous Q8H  Wound culture: Abundant WBC abundant gram-positive cocci few E. 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 Alphonsa Overall   FEN: IV fluids/clear liquids ID: Meropenem 2/8 >> day 3 DVT:  Heparin Follow up:  TBD/[facility] - Dr. Earlean Shawl   Plan: Continue wet-to-dry dressings.  When the cultures are back we can switch him over to oral antibiotics.  Case manager to see what help he can get with dressing changes at home.  He lives alone and does not have much support.  Dr. Hassell Done recommends ongoing therapy for at least a week after discharge.  I will add a probiotic.  Begin teaching patient how to do wet-to-dry dressings at home.  LOS: 3 days    Ann-Marie Kluge 02/23/2018 [phone]

## 2018-02-24 LAB — CBC
HCT: 31 % — ABNORMAL LOW (ref 39.0–52.0)
Hemoglobin: 8.2 g/dL — ABNORMAL LOW (ref 13.0–17.0)
MCH: 17.4 pg — ABNORMAL LOW (ref 26.0–34.0)
MCHC: 26.5 g/dL — AB (ref 30.0–36.0)
MCV: 65.8 fL — ABNORMAL LOW (ref 80.0–100.0)
Platelets: 340 10*3/uL (ref 150–400)
RBC: 4.71 MIL/uL (ref 4.22–5.81)
RDW: 22.6 % — ABNORMAL HIGH (ref 11.5–15.5)
WBC: 17 10*3/uL — ABNORMAL HIGH (ref 4.0–10.5)
nRBC: 0.1 % (ref 0.0–0.2)

## 2018-02-24 LAB — COMPREHENSIVE METABOLIC PANEL
ALT: 9 U/L (ref 0–44)
AST: 7 U/L — ABNORMAL LOW (ref 15–41)
Albumin: 2.6 g/dL — ABNORMAL LOW (ref 3.5–5.0)
Alkaline Phosphatase: 35 U/L — ABNORMAL LOW (ref 38–126)
Anion gap: 8 (ref 5–15)
BUN: 6 mg/dL (ref 6–20)
CO2: 27 mmol/L (ref 22–32)
CREATININE: 0.79 mg/dL (ref 0.61–1.24)
Calcium: 8.6 mg/dL — ABNORMAL LOW (ref 8.9–10.3)
Chloride: 102 mmol/L (ref 98–111)
GFR calc Af Amer: >60 mL/min (ref >60)
GFR calc non Af Amer: >60 mL/min (ref >60)
Glucose, Bld: 81 mg/dL (ref 70–99)
Potassium: 3.6 mmol/L (ref 3.5–5.1)
Sodium: 137 mmol/L (ref 135–145)
Total Bilirubin: 0.4 mg/dL (ref 0.3–1.2)
Total Protein: 6.1 g/dL — ABNORMAL LOW (ref 6.5–8.1)

## 2018-02-24 LAB — MAGNESIUM: Magnesium: 2 mg/dL (ref 1.7–2.4)

## 2018-02-24 MED ORDER — HYDROCODONE-ACETAMINOPHEN 5-325 MG PO TABS: 1.0000 | ORAL_TABLET | ORAL | 0 refills | Status: AC | PRN

## 2018-02-24 MED ORDER — AMOXICILLIN-POT CLAVULANATE 875-125 MG PO TABS: 1.0000 | ORAL_TABLET | Freq: Two times a day (BID) | ORAL | 0 refills | Status: AC

## 2018-02-24 MED ORDER — AMOXICILLIN-POT CLAVULANATE 875-125 MG PO TABS: 1.0000 | ORAL_TABLET | Freq: Two times a day (BID) | ORAL | Status: DC

## 2018-02-24 NOTE — Progress Notes (Signed)
[facility] PROGRESS NOTE Subjective Patient in room.  Still with a little discomfort but feels that he is doing better.  He contacted [facility] health care's system.  His insurance is contracted with [facility] and he must see a [facility] doctor.  They assigned him to follow-up with Dr. Kinnie Scales a [facility] gastroenterologist here in [place] and he has a hospital follow-up in March after discharge.  I sent my last summary note to Dr. Kinnie Scales so he will have those records.  Patient is on a diet which he seems to be tolerating fairly well.  He had his last Humira injection a week or so ago.  He has been on chronic Humira.  He is not currently on prednisone.  Objective: Vital signs in last 24 hours: Temp:  [98.7 F (37.1 C)-99.6 F (37.6 C)] 99.6 F (37.6 C) (02/12 0438) Pulse Rate:  [78-92] 82 (02/12 0438) Resp:  [16-20] 17 (02/12 0438) BP: (105-113)/(67-72) 105/67 (02/12 0438) SpO2:  [96 %-99 %] 96 % (02/12 0438) Last BM Date: 02/23/18  Intake/Output from previous day: 02/11 0701 - 02/12 0700 In: 3510.1 [P.O.:960; I.V.:1650.1; IV Piggyback:900] Out: 3100 [Urine:3100] Intake/Output this shift: No intake/output data recorded.  PE: General--watching television no distress Heart-- Lungs-- Abdomen--ostomy in place draining stool  Lab Results: Recent Labs    02/23/18 0522 02/24/18 0330  WBC 13.9* 17.0*  HGB 7.8* 8.2*  HCT 29.6* 31.0*  PLT 306 340   BMET Recent Labs    02/23/18 0522 02/24/18 0330  NA 137 137  K 3.5 3.6  CL 102 102  CO2 26 27  CREATININE 0.86 0.79   LFT Recent Labs    02/23/18 0522 02/24/18 0330  PROT 5.5* 6.1*  AST 9* 7*  ALT 9 9  ALKPHOS 33* 35*  BILITOT 0.4 0.4   PT/INR No results for input(s): LABPROT, INR in the last 72 hours. PANCREAS No results for input(s): LIPASE in the last 72 hours.       Studies/Results: No results found.  Medications: I have reviewed the patient's current medications.  Assessment:    1.  Fistulous Crohn's disease.  He has a quite complex patient who has had a partial colectomy with permanent colostomy done a number of years ago in the distal transverse colon with a resection of a portion of his small bowel.  He has apparent fistulous throughout his abdomen as well as 2 abdominal wall abscess which is currently being drained.   Plan: 1.  Would continue patient on antibiotics and Humira.  Would hold off on steroids now.  Would have him follow-up with [facility] to decide if his Biologics need to be changed and decide when to resume steroids.  We will be available to see him here in the hospital if needed.  We will sign off for now but please call us if needed.   Tresea Mall 02/24/2018, 7:41 AM  This note was created using voice recognition software. Minor errors may Have occurred unintentionally.  Pager: [phone] If no answer or after hours call [phone]

## 2018-02-24 NOTE — Progress Notes (Signed)
Patient discharged home in stable condition. Discharge instructions and script given. Pt verbalized understanding. No immediate questions or concerns at this time. Pt opted to ambulate off the unit.

## 2018-02-24 NOTE — Progress Notes (Signed)
3 Days Post-Op    CC:  Abdominal abscess  Subjective: He did well with dressing changes and is being converted to Augmentin for oral antibiotic.  We looked at the wound and it looks good, no identifiable source of abscess or fistula into the open wound.  Drainage on the dressing is serous, no feculent drainage.  Objective: Vital signs in last 24 hours: Temp:  [98.7 F (37.1 C)-99.6 F (37.6 C)] 99.6 F (37.6 C) (02/12 0438) Pulse Rate:  [78-92] 82 (02/12 0438) Resp:  [16-20] 17 (02/12 0438) BP: (105-113)/(67-72) 105/67 (02/12 0438) SpO2:  [96 %-99 %] 96 % (02/12 0438) Last BM Date: 02/23/18 960 Po 1650 IV 3100 urine Afebrile, VSS WBC rising:  19.8>>11.9>>13.9..17.0 today Other labs are stable Last CT 02/20/18 Decadron  X 1 dose -02/21/18 Intake/Output from previous day: 02/11 0701 - 02/12 0700 In: 3510.1 [P.O.:960; I.V.:1650.1; IV Piggyback:900] Out: 3100 [Urine:3100] Intake/Output this shift: No intake/output data recorded.  General appearance: alert, cooperative and no distress Resp: clear to auscultation bilaterally GI: soft, non-tender; bowel sounds normal; no masses,  no organomegaly and He is tolerating a regular diet.  Open wound is clean and as noted above there is no identifiable source of drainage from either the colostomy or the lower portion of the abdominal wound.  Lab Results:  Recent Labs    02/23/18 0522 02/24/18 0330  WBC 13.9* 17.0*  HGB 7.8* 8.2*  HCT 29.6* 31.0*  PLT 306 340    BMET Recent Labs    02/23/18 0522 02/24/18 0330  NA 137 137  K 3.5 3.6  CL 102 102  CO2 26 27  GLUCOSE 78 81  BUN 9 6  CREATININE 0.86 0.79  CALCIUM 8.1* 8.6*   PT/INR No results for input(s): LABPROT, INR in the last 72 hours.  Recent Labs  Lab 02/20/18 1348 02/21/18 0519 02/23/18 0522 02/24/18 0330  AST 12* 11* 9* 7*  ALT 14 11 9 9   ALKPHOS 42 34* 33* 35*  BILITOT 0.3 0.3 0.4 0.4  PROT 7.3 6.1* 5.5* 6.1*  ALBUMIN 3.1* 2.5* 2.3* 2.6*     Lipase     Component Value Date/Time   LIPASE 27 02/20/2018 1348     Medications: . feeding supplement  1 Container Oral TID BM  . heparin  5,000 Units Subcutaneous Q8H  . saccharomyces boulardii  250 mg Oral BID    Escherichia coli    MIC    AMPICILLIN 8 SENSITIVE  Sensitive    AMPICILLIN/SULBACTAM 4 SENSITIVE  Sensitive    CEFAZOLIN <=4 SENSITIVE  Sensitive    CEFEPIME <=1 SENSITIVE  Sensitive    CEFTAZIDIME <=1 SENSITIVE  Sensitive    CEFTRIAXONE <=1 SENSITIVE  Sensitive    CIPROFLOXACIN >=4 RESISTANT  Resistant    Extended ESBL NEGATIVE  Sensitive    GENTAMICIN <=1 SENSITIVE  Sensitive    IMIPENEM <=0.25 SENS... Sensitive    PIP/TAZO <=4 SENSITIVE  Sensitive    TRIMETH/SULFA <=20 SENSIT... 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 Alphonsa Overall   FEN: IV fluids/clear liquids ID: Meropenem 2/8>> day 3 DVT: Heparin Follow up: TBD/[facility] - Dr. Earlean Shawl   Plan: Wounds been seen and examined by Dr. Hassell Done.  We agree  with converting him to Augmentin for 7 more days.  Continue the probiotic.  We warned him that if he has any new drainage, fever, abdominal discomfort or just generally feels bad, he should return for evaluation.  With his insurance issues we also recommended he possibly consider going to [facility] facility, that his insurance will cover more completely.  He has follow-up with Dr. Lucia Gaskins listed in the AVS.  LOS: 4 days    Tai Syfert 02/24/2018 [phone]

## 2018-02-24 NOTE — Progress Notes (Signed)
Dressing change performed by patient.

## 2018-02-24 NOTE — Discharge Summary (Signed)
Physician Discharge Summary  Tom Ferguson QZR:[id number] DOB: 10-21-1990 DOA: 02/20/2018  PCP: Scot Jun, FNP  Admit date: 02/20/2018 Discharge date: 02/24/2018  Admitted From: Disposition: Home home  Recommendations for Outpatient Follow-up:  1. Follow up with PCP in 1-2 weeks 2. Please obtain BMP/CBC in one week Follow-up with [place] none Equipment none  Discharge Condition stable CODE STATUS full code Diet recommendation: Cardiac  Brief/Interim Summary:28 year old with history of complicated Crohn's disease status post colostomy in 2008 with recurrent flare, malnutrition, history of DVT on prednisone and Humira came to the hospital with complains of worsening abdominal pain and some drainage around the site of colostomy.  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  Patient was taken for I&D on 2/9 by general surgery.  Gastroenterology was consulted.    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    Patient was seen by general surgery and GI.  Prednisone was on hold.  He was treated with meropenem.  He will be discharged home on Augmentin for 7 days.  Discussed with the patient to follow-up at [facility] due to the fact that his insurance covers [facility]    Anemia of chronic disease -Closely follow hemoglobin.      Nutrition Problem: Moderate Malnutrition Etiology: chronic illness(Crohn's disease)    Signs/Symptoms: mild muscle depletion, energy intake < or equal to 75% for > or equal to 1 month     Interventions: Boost Breeze, Refer to RD note for recommendations  Estimated body mass index is 23.11 kg/m as  calculated from the following:   Height as of this encounter: 6' 2"  (1.88 m).   Weight as of this encounter: 81.6 kg.  Discharge Instructions  Discharge Instructions    Call MD for:  difficulty breathing, headache or visual disturbances   Complete by:  As directed    Call MD for:  persistant nausea and vomiting   Complete by:  As directed    Call MD for:  temperature >100.4   Complete by:  As directed    Diet - low sodium heart healthy   Complete by:  As directed    Increase activity slowly   Complete by:  As directed      Allergies as of 02/24/2018   No Known Allergies     Medication List    STOP taking these medications   dicyclomine 20 MG tablet Commonly known as:  BENTYL   moxifloxacin 0.5 % ophthalmic solution Commonly known as:  VIGAMOX   Polyethyl Glycol-Propyl Glycol 0.4-0.3 % Gel ophthalmic gel Commonly known as:  SYSTANE   predniSONE 10 MG tablet Commonly known as:  DELTASONE     TAKE these medications   amoxicillin-clavulanate 875-125 MG tablet Commonly known as:  AUGMENTIN Take 1 tablet by mouth every 12 (twelve) hours. Start taking on:  February 25, 2018   HUMIRA 40 MG/0.4ML Pskt Generic drug:  Adalimumab Inject 40 mg into the skin every 14 (fourteen) days.   HYDROcodone-acetaminophen 5-325 MG tablet Commonly known as:  NORCO/VICODIN Take 1-2 tablets by mouth every 4 (four) hours as needed for moderate pain.   ibuprofen 800 MG tablet Commonly known as:  ADVIL,MOTRIN Take  800 mg by mouth every 8 (eight) hours as needed for moderate pain.      Follow-up Information    Alphonsa Overall, MD Follow up on 03/18/2018.   Specialty:  General Surgery Why:  Your appointment is at 4:00 PM,.  Be at the office 30 minutes early for check in.  Bring photo ID and insurance information.   Contact information: [place] [address] [phone]          No Known Allergies  Consultations:  General surgery and  GI   Procedures/Studies: Ct Abdomen Pelvis W Contrast  Result Date: 02/20/2018 CLINICAL DATA:  Crohn disease status post partial colectomy with end colostomy. New swelling and foul-smelling drainage adjacent to the colostomy. EXAM: CT ABDOMEN AND PELVIS WITH CONTRAST TECHNIQUE: Multidetector CT imaging of the abdomen and pelvis was performed using the standard protocol following bolus administration of intravenous contrast. CONTRAST:  193m ISOVUE-300 IOPAMIDOL (ISOVUE-300) INJECTION 61% COMPARISON:  08/09/2017 CT abdomen/pelvis. FINDINGS: Lower chest: No significant pulmonary nodules or acute consolidative airspace disease. Hepatobiliary: Normal liver size. No liver mass. Normal gallbladder with no radiopaque cholelithiasis. No biliary ductal dilatation. Pancreas: Normal, with no mass or duct dilation. Spleen: Normal size. No mass. Adrenals/Urinary Tract: Normal adrenals. Normal kidneys with no hydronephrosis and no renal mass. Normal bladder. Stomach/Bowel: Patient is status post partial distal colectomy with end colostomy in the ventral left abdominal wall. There is a complex chronic left upper quadrant fistula involving the greater curvature of the body of the stomach, the distal transverse colon, and the proximal jejunum. 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 No small bowel dilatation or new sites of small bowel wall thickening. Appendix not discretely visualized. Collapsed normal rectal remnant. Vascular/Lymphatic: Normal caliber abdominal aorta. Patent portal, splenic, hepatic and renal veins. No pathologically enlarged lymph nodes in the abdomen or pelvis. Reproductive: Top-normal size prostate. Other: No  pneumoperitoneum.  No ascites. Musculoskeletal: No aggressive appearing focal osseous lesions. IMPRESSION: 1. 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 2. The complex chronic left upper quadrant fistula involves the greater curvature of the body of the stomach, the proximal jejunum and the distal transverse colon. 3. No evidence of bowel obstruction. Electronically Signed   By: JIlona SorrelM.D.   On: 02/20/2018 20:15   (Echo, Carotid, EGD, Colonoscopy, ERCP)    Subjective:   Discharge Exam: Vitals:   02/23/18 2101 02/24/18 0438  BP: 113/71 105/67  Pulse: 92 82  Resp: 16 17  Temp: 99.6 F (37.6 C) 99.6 F (37.6 C)  SpO2: 98% 96%   Vitals:   02/23/18 0548 02/23/18 1310 02/23/18 2101 02/24/18 0438  BP: 115/73 113/72 113/71 105/67  Pulse: 62 78 92 82  Resp: 16 20 16 17   Temp: 98.1 F (36.7 C) 98.7 F (37.1 C) 99.6 F (37.6 C) 99.6 F (37.6 C)  TempSrc: Oral Oral Oral Oral  SpO2: 100% 99% 98% 96%  Weight:      Height:        General: Pt is alert, awake, not in acute distress Cardiovascular:  RRR, S1/S2 +, no rubs, no gallops Respiratory: CTA bilaterally, no wheezing, no rhonchi Abdominal: Soft, NT, ND, bowel sounds + colostomy in place with liquid brown stool incision covered with dressing Extremities: no edema, no cyanosis    The results of significant diagnostics from this hospitalization (including imaging, microbiology, ancillary and laboratory) are listed below for reference.     Microbiology: Recent Results (from the past 240 hour(s))  Surgical pcr screen     Status: None   Collection Time: 02/21/18  7:23 AM  Result Value Ref Range Status   MRSA, PCR NEGATIVE NEGATIVE Final   Staphylococcus aureus NEGATIVE NEGATIVE Final    Comment: (NOTE) The Xpert SA Assay (FDA  approved for NASAL specimens in patients 30 years of age and older), is one component of a comprehensive surveillance program. It is not intended to diagnose infection nor to guide or monitor treatment. Performed at [facility], [place] [address], [place], [place] [address]   Aerobic/Anaerobic Culture (surgical/deep wound)     Status: None (Preliminary result)   Collection Time: 02/21/18 10:18 AM  Result Value Ref Range Status   Specimen Description   Final    ABSCESS ABDOMEN Performed at [facility], [place] [address], [place], [place] [address]    Special Requests   Final    NONE Performed at [facility], [place] [address] [place], [place] [address]    Gram Stain   Final    ABUNDANT WBC PRESENT, PREDOMINANTLY PMN ABUNDANT GRAM POSITIVE COCCI    Culture   Final    FEW ESCHERICHIA COLI MIXED ANAEROBIC FLORA PRESENT.  CALL LAB IF FURTHER IID REQUIRED.    Report Status PENDING  Incomplete   Organism ID, Bacteria ESCHERICHIA COLI  Final      Susceptibility   Escherichia coli - MIC*    AMPICILLIN 8 SENSITIVE Sensitive     CEFAZOLIN <=4 SENSITIVE Sensitive     CEFEPIME <=1 SENSITIVE Sensitive     CEFTAZIDIME <=1 SENSITIVE Sensitive     CEFTRIAXONE <=1 SENSITIVE Sensitive     CIPROFLOXACIN >=4 RESISTANT Resistant     GENTAMICIN <=1 SENSITIVE Sensitive     IMIPENEM <=0.25 SENSITIVE Sensitive     TRIMETH/SULFA <=20 SENSITIVE Sensitive     AMPICILLIN/SULBACTAM 4 SENSITIVE Sensitive     PIP/TAZO <=4 SENSITIVE Sensitive     Extended ESBL NEGATIVE Sensitive     * FEW ESCHERICHIA COLI     Labs: BNP (last 3 results) No results for input(s): BNP in the last 8760 hours. Basic Metabolic Panel: Recent Labs  Lab 02/20/18 1348 02/21/18 0519 02/23/18 0522 02/24/18 0330  NA 140 138 137 137  K 3.5 3.7 3.5 3.6  CL 102 105 102 102  CO2 30 27 26 27   GLUCOSE 100* 72 78 81  BUN 12 13 9 6   CREATININE 0.91 0.74 0.86 0.79  CALCIUM 9.2 8.2* 8.1*  8.6*  MG  --   --  1.9 2.0   Liver Function Tests: Recent Labs  Lab 02/20/18 1348 02/21/18 0519 02/23/18 0522 02/24/18 0330  AST 12* 11* 9* 7*  ALT 14 11 9 9   ALKPHOS 42 34* 33* 35*  BILITOT 0.3 0.3 0.4 0.4  PROT 7.3 6.1* 5.5* 6.1*  ALBUMIN 3.1* 2.5* 2.3* 2.6*   Recent Labs  Lab 02/20/18 1348  LIPASE 27   No results for input(s): AMMONIA in the last 168 hours. CBC: Recent Labs  Lab 02/20/18 1348 02/21/18 0519 02/23/18 0522 02/24/18  0330  WBC 19.8* 11.9* 13.9* 17.0*  HGB 8.9* 7.5* 7.8* 8.2*  HCT 34.4* 29.0* 29.6* 31.0*  MCV 67.2* 67.6* 66.5* 65.8*  PLT 378 309 306 340   Cardiac Enzymes: No results for input(s): CKTOTAL, CKMB, CKMBINDEX, TROPONINI in the last 168 hours. BNP: Invalid input(s): POCBNP CBG: No results for input(s): GLUCAP in the last 168 hours. D-Dimer No results for input(s): DDIMER in the last 72 hours. Hgb A1c No results for input(s): HGBA1C in the last 72 hours. Lipid Profile No results for input(s): CHOL, HDL, LDLCALC, TRIG, CHOLHDL, LDLDIRECT in the last 72 hours. Thyroid function studies No results for input(s): TSH, T4TOTAL, T3FREE, THYROIDAB in the last 72 hours.  Invalid input(s): FREET3 Anemia work up No results for input(s): VITAMINB12, FOLATE, FERRITIN, TIBC, IRON, RETICCTPCT in the last 72 hours. Urinalysis    Component Value Date/Time   COLORURINE YELLOW 02/20/2018 [place] 02/20/2018 1348   LABSPEC 1.020 02/20/2018 1348   PHURINE 5.0 02/20/2018 1348   GLUCOSEU NEGATIVE 02/20/2018 1348   HGBUR NEGATIVE 02/20/2018 [place] 02/20/2018 1348   KETONESUR NEGATIVE 02/20/2018 1348   PROTEINUR NEGATIVE 02/20/2018 1348   NITRITE NEGATIVE 02/20/2018 1348   LEUKOCYTESUR NEGATIVE 02/20/2018 1348   Sepsis Labs Invalid input(s): PROCALCITONIN,  WBC,  LACTICIDVEN Microbiology Recent Results (from the past 240 hour(s))  Surgical pcr screen     Status: None   Collection Time: 02/21/18  7:23 AM   Result Value Ref Range Status   MRSA, PCR NEGATIVE NEGATIVE Final   Staphylococcus aureus NEGATIVE NEGATIVE Final    Comment: (NOTE) The Xpert SA Assay (FDA approved for NASAL specimens in patients 23 years of age and older), is one component of a comprehensive surveillance program. It is not intended to diagnose infection nor to guide or monitor treatment. Performed at [facility], [place] [address] [place], [place] [address]   Aerobic/Anaerobic Culture (surgical/deep wound)     Status: None (Preliminary result)   Collection Time: 02/21/18 10:18 AM  Result Value Ref Range Status   Specimen Description   Final    ABSCESS ABDOMEN Performed at [facility], [place] [address], [place], [place] [address]    Special Requests   Final    NONE Performed at [facility], [place] [address] [place], [place] [address]    Gram Stain   Final    ABUNDANT WBC PRESENT, PREDOMINANTLY PMN ABUNDANT GRAM POSITIVE COCCI    Culture   Final    FEW ESCHERICHIA COLI MIXED ANAEROBIC FLORA PRESENT.  CALL LAB IF FURTHER IID REQUIRED.    Report Status PENDING  Incomplete   Organism ID, Bacteria ESCHERICHIA COLI  Final      Susceptibility   Escherichia coli - MIC*    AMPICILLIN 8 SENSITIVE Sensitive     CEFAZOLIN <=4 SENSITIVE Sensitive     CEFEPIME <=1 SENSITIVE Sensitive     CEFTAZIDIME <=1 SENSITIVE Sensitive     CEFTRIAXONE <=1 SENSITIVE Sensitive     CIPROFLOXACIN >=4 RESISTANT Resistant     GENTAMICIN <=1 SENSITIVE Sensitive     IMIPENEM <=0.25 SENSITIVE Sensitive     TRIMETH/SULFA <=20 SENSITIVE Sensitive     AMPICILLIN/SULBACTAM 4 SENSITIVE Sensitive     PIP/TAZO <=4 SENSITIVE Sensitive     Extended ESBL NEGATIVE Sensitive     * FEW ESCHERICHIA COLI     Time coordinating discharge: 33 minutes  SIGNED:   Georgette Shell, MD  [facility] 02/24/2018, 11:43 AM Pager   If 7PM-7AM, please contact night-coverage [email or web address] Password  [email or web address]

## 2018-02-27 LAB — AEROBIC/ANAEROBIC CULTURE W GRAM STAIN (SURGICAL/DEEP WOUND)

## 2018-02-27 LAB — AEROBIC/ANAEROBIC CULTURE (SURGICAL/DEEP WOUND)

## 2020-01-19 IMAGING — CT CT ABD-PELV W/ CM
2 of 4 series · 15 of 46 positions shown, 17 images · IV contrast (ISOVUE)
Comparison: 08/09/2017 CT abdomen/pelvis.

CLINICAL DATA: Crohn disease status post partial colectomy with end
colostomy. New swelling and foul-smelling drainage adjacent to the
colostomy.

EXAM:
CT ABDOMEN AND PELVIS WITH CONTRAST
TECHNIQUE: Multidetector CT imaging of the abdomen and pelvis was performed
using the standard protocol following bolus administration of
intravenous contrast.
CONTRAST:  100mL WKIKL4-NOO IOPAMIDOL (WKIKL4-NOO) INJECTION 61%

[Series 2: axial st · axial · 0.71mm/px · z∈[-524,-129]mm · 12 of 89 slices shown, 14 images]
[im 5/89  soft-tissue]
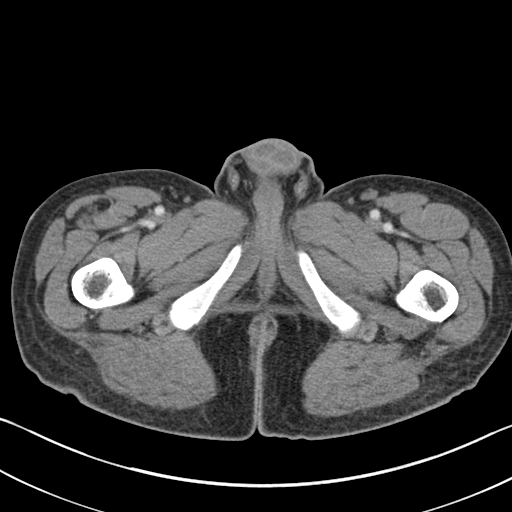
[im 5/89  bone]
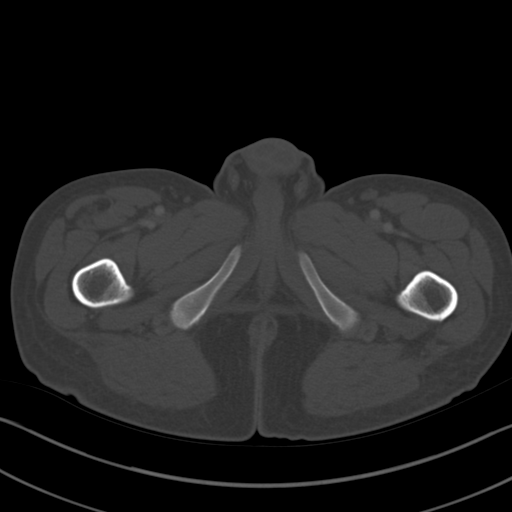
[im 14/89  soft-tissue]
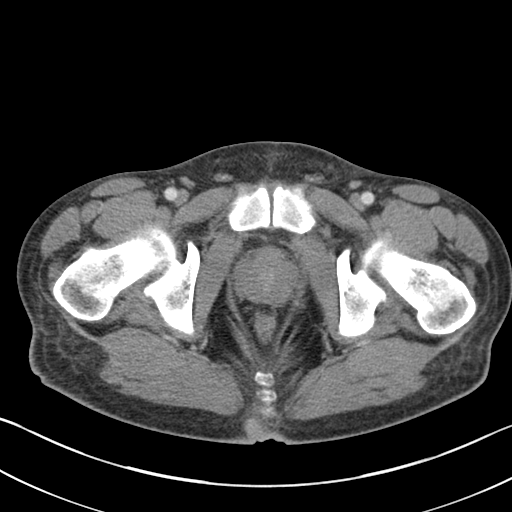
[im 19/89  soft-tissue]
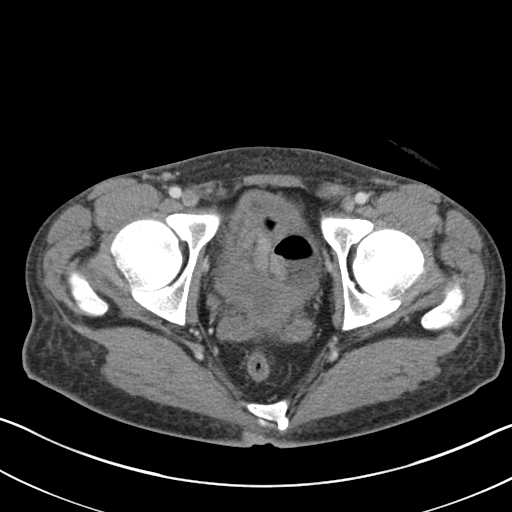
[im 28/89  soft-tissue]
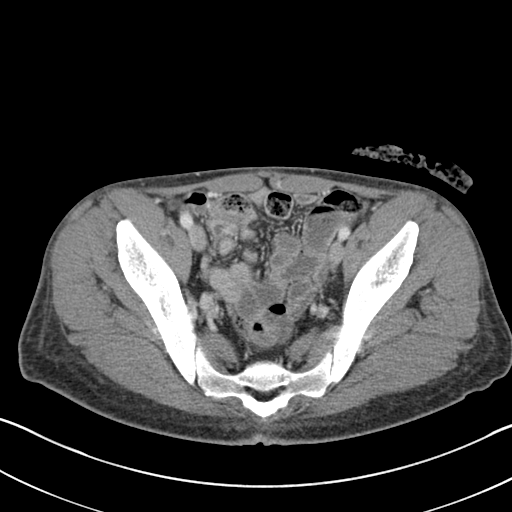
[im 33/89  soft-tissue]
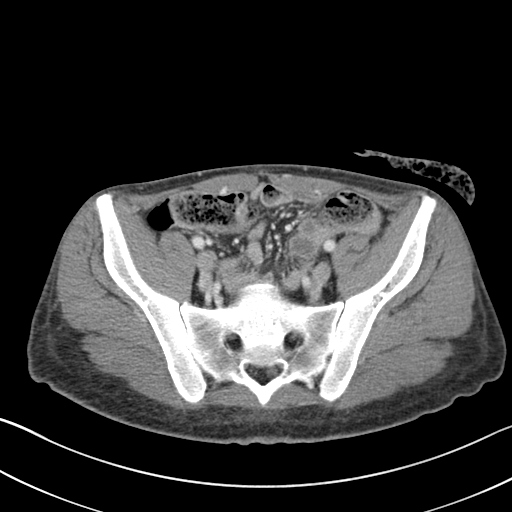
[im 42/89  soft-tissue]
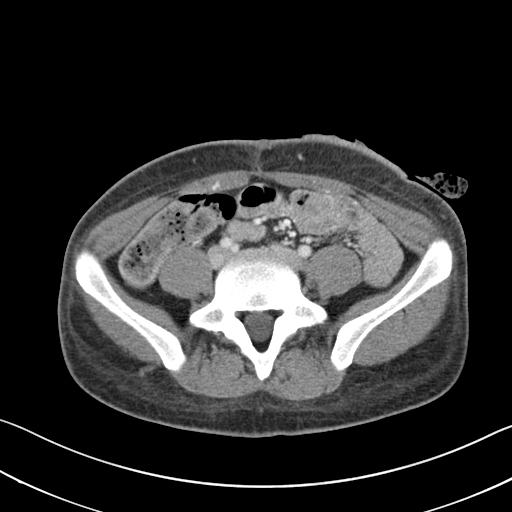
[im 47/89  soft-tissue]
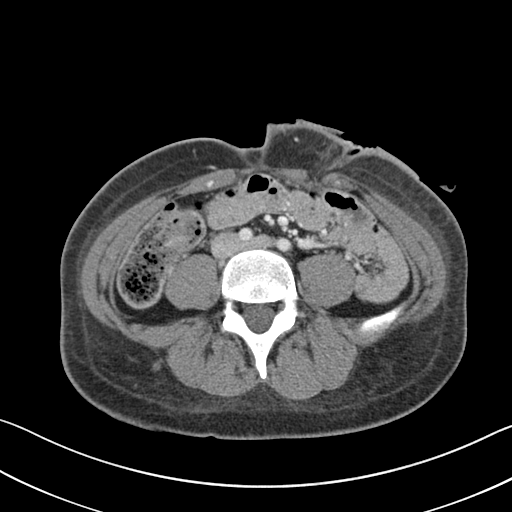
[im 56/89  soft-tissue]
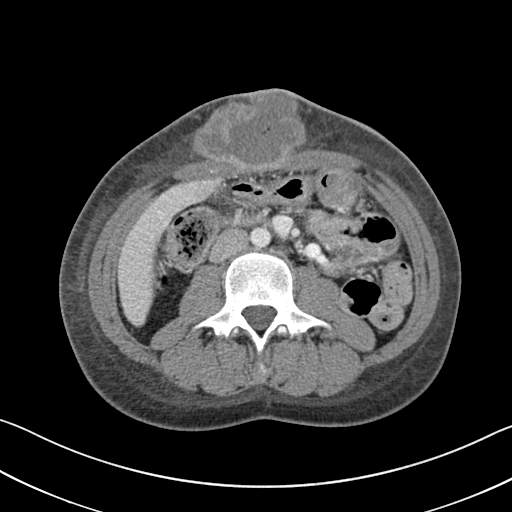
[im 61/89  soft-tissue]
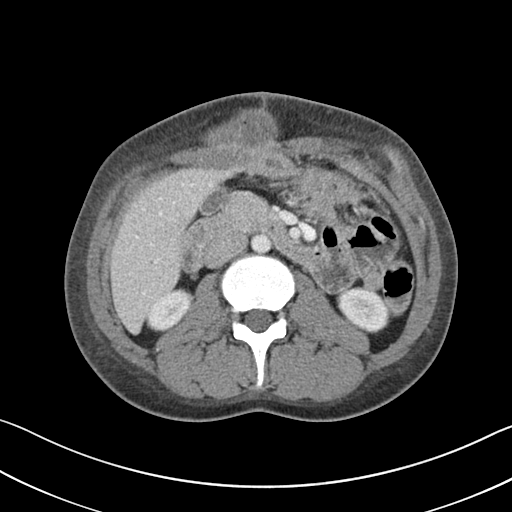
[im 61/89  bone]
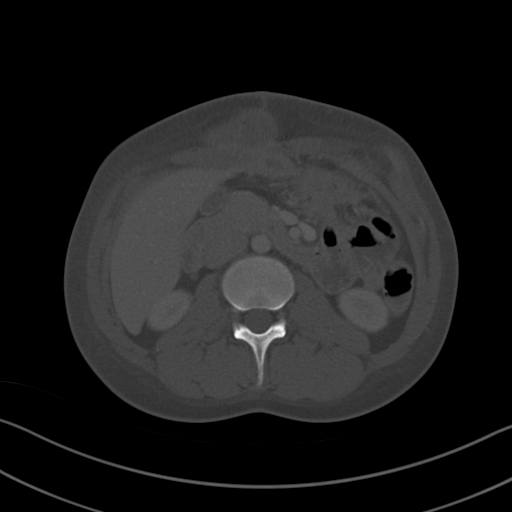
[im 70/89  soft-tissue]
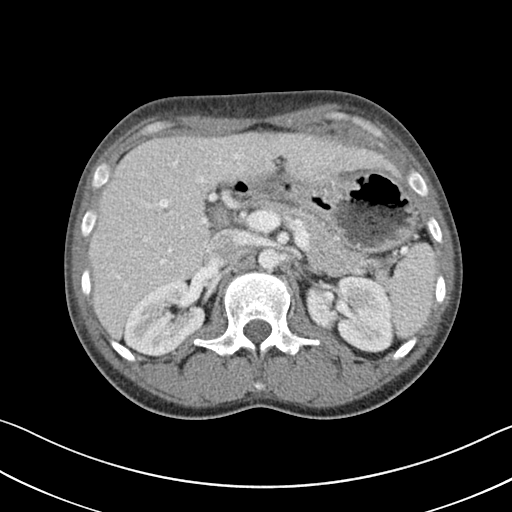
[im 75/89  soft-tissue]
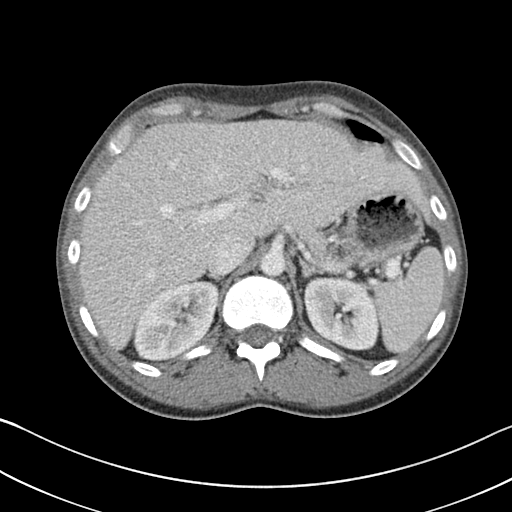
[im 84/89  soft-tissue]
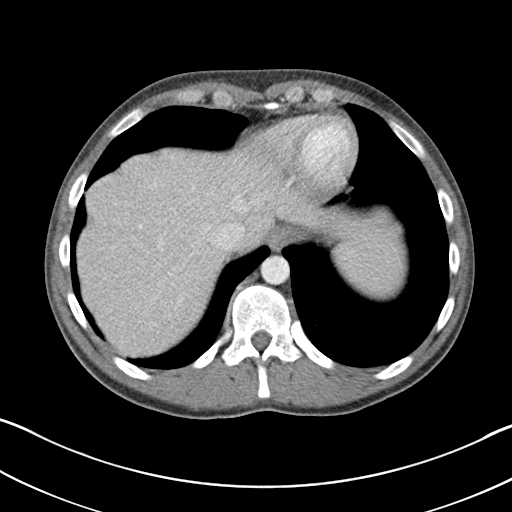

[Series 5: coronal st · coronal · 0.63mm/px · 3 of 89 slices shown]
[im 30/89  soft-tissue]
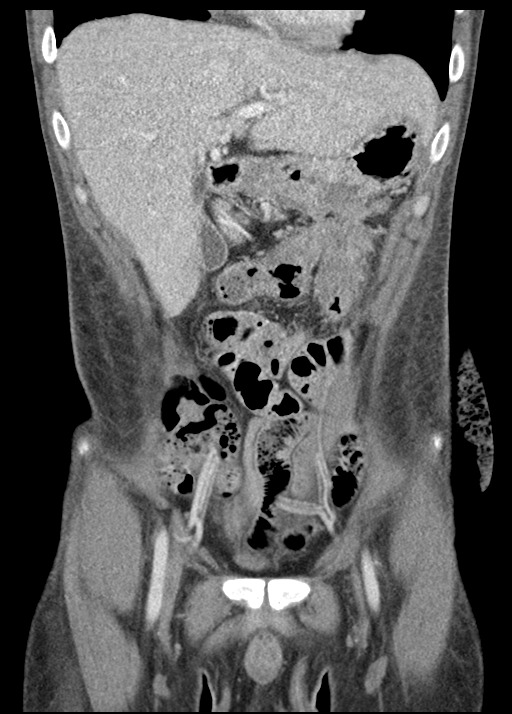
[im 40/89  soft-tissue]
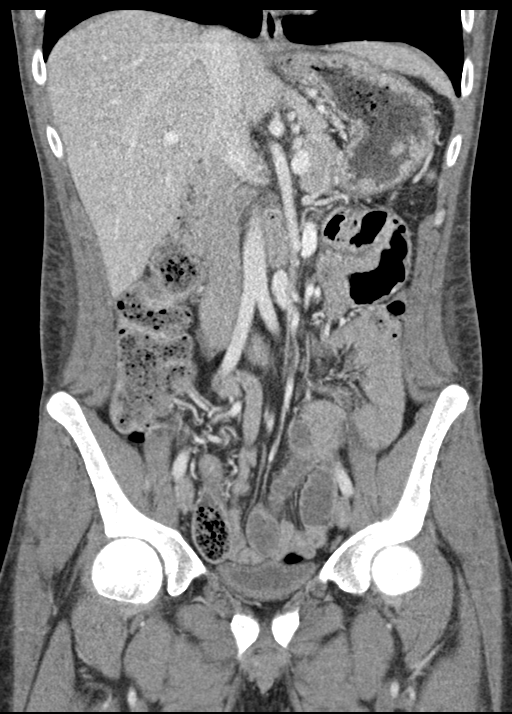
[im 49/89  soft-tissue]
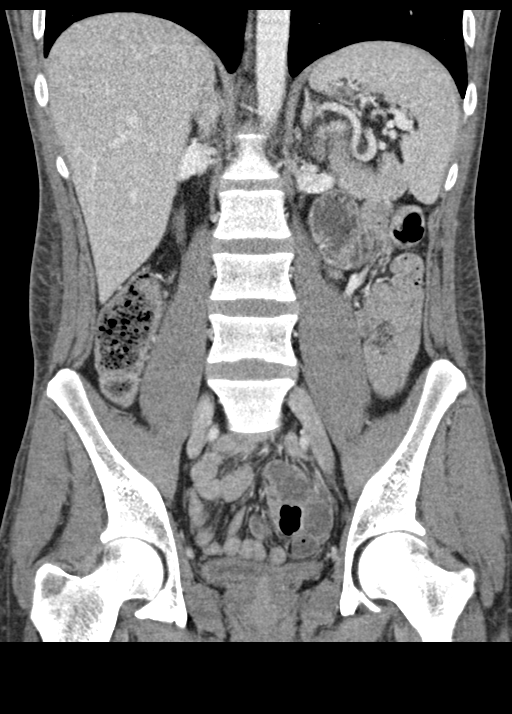

[15 of 46 positions shown; findings below may reference images not displayed]

FINDINGS: Lower chest: No significant pulmonary nodules or acute consolidative
airspace disease.

Hepatobiliary: Normal liver size. No liver mass. Normal gallbladder
with no radiopaque cholelithiasis. No biliary ductal dilatation.

Pancreas: Normal, with no mass or duct dilation.

Spleen: Normal size. No mass.

Adrenals/Urinary Tract: Normal adrenals. Normal kidneys with no
hydronephrosis and no renal mass. Normal bladder.

Stomach/Bowel: Patient is status post partial distal colectomy with
end colostomy in the ventral left abdominal wall. There is a complex
chronic left upper quadrant fistula involving the greater curvature
of the body of the stomach, the distal transverse colon, and the
proximal jejunum. There is a new enterocutaneous fistula arising
from this complex chronic left upper quadrant fistula and tracking
to the skin surface just to the left of midline in the
supraumbilical region, complicated by a new 8.0 x 5.2 x 6.1 cm gas
containing subcutaneous abscess in the midline with thick enhancing
wall (series 2/image 35). No small bowel dilatation or new sites of
small bowel wall thickening. Appendix not discretely visualized.
Collapsed normal rectal remnant.

Vascular/Lymphatic: Normal caliber abdominal aorta. Patent portal,
splenic, hepatic and renal veins. No pathologically enlarged lymph
nodes in the abdomen or pelvis.

Reproductive: Top-normal size prostate.

Other: No pneumoperitoneum.  No ascites.

Musculoskeletal: No aggressive appearing focal osseous lesions.
IMPRESSION: 1. New enterocutaneous fistula arising from a complex chronic left
upper quadrant fistula, complicated by a new large gas-containing
abscess in the midline subcutaneous supraumbilical ventral abdominal
wall along the enterocutaneous fistula tract.
2. The complex chronic left upper quadrant fistula involves the
greater curvature of the body of the stomach, the proximal jejunum
and the distal transverse colon.
3. No evidence of bowel obstruction.
